# Patient Record
Sex: Female | Born: 1975 | Race: White | Hispanic: No | Marital: Married
Health system: Midwestern US, Community
[De-identification: ages and names within clinical notes are randomized; demographics above are authoritative.]

## PROBLEM LIST (undated history)

## (undated) DIAGNOSIS — Z8742 Personal history of other diseases of the female genital tract: Secondary | ICD-10-CM

## (undated) DIAGNOSIS — J302 Other seasonal allergic rhinitis: Secondary | ICD-10-CM

## (undated) DIAGNOSIS — R112 Nausea with vomiting, unspecified: Secondary | ICD-10-CM

## (undated) DIAGNOSIS — I10 Essential (primary) hypertension: Secondary | ICD-10-CM

## (undated) DIAGNOSIS — Z9889 Other specified postprocedural states: Secondary | ICD-10-CM

## (undated) HISTORY — PX: APPENDECTOMY: SHX54

## (undated) HISTORY — PX: BREAST SURGERY: SHX581

## (undated) HISTORY — PX: DIAGNOSTIC LAPAROSCOPY: SUR761

## (undated) HISTORY — PX: KNEE SURGERY: SHX244

## (undated) HISTORY — PX: OTHER SURGICAL HISTORY: SHX169

---

## 2012-06-28 ENCOUNTER — Other Ambulatory Visit (HOSPITAL_COMMUNITY): Payer: Self-pay | Admitting: Obstetrics and Gynecology

## 2012-06-28 DIAGNOSIS — N979 Female infertility, unspecified: Secondary | ICD-10-CM

## 2012-07-06 ENCOUNTER — Ambulatory Visit (HOSPITAL_COMMUNITY)
Admission: RE | Admit: 2012-07-06 | Discharge: 2012-07-06 | Disposition: A | Discharge status: Home / Self Care | Payer: BC Managed Care – PPO | Source: Ambulatory Visit | Attending: Obstetrics and Gynecology | Admitting: Obstetrics and Gynecology

## 2012-07-06 DIAGNOSIS — N979 Female infertility, unspecified: Secondary | ICD-10-CM | POA: Insufficient documentation

## 2012-07-06 MED ORDER — IOHEXOL 300 MG/ML  SOLN: 20.0000 mL | Freq: Once | INTRAMUSCULAR | Status: AC | PRN

## 2012-07-07 ENCOUNTER — Other Ambulatory Visit: Payer: Self-pay | Admitting: Obstetrics and Gynecology

## 2012-07-07 DIAGNOSIS — N979 Female infertility, unspecified: Secondary | ICD-10-CM

## 2012-07-09 ENCOUNTER — Ambulatory Visit
Admission: RE | Admit: 2012-07-09 | Discharge: 2012-07-09 | Disposition: A | Discharge status: Home / Self Care | Payer: BC Managed Care – PPO | Source: Ambulatory Visit | Attending: Obstetrics and Gynecology | Admitting: Obstetrics and Gynecology

## 2012-07-09 DIAGNOSIS — N979 Female infertility, unspecified: Secondary | ICD-10-CM

## 2012-10-03 ENCOUNTER — Other Ambulatory Visit: Payer: Self-pay | Admitting: Obstetrics and Gynecology

## 2012-10-05 ENCOUNTER — Encounter (HOSPITAL_COMMUNITY): Payer: Self-pay | Admitting: Pharmacist

## 2012-10-10 ENCOUNTER — Encounter (HOSPITAL_COMMUNITY): Payer: Self-pay

## 2012-10-10 ENCOUNTER — Encounter (HOSPITAL_COMMUNITY)
Admission: RE | Admit: 2012-10-10 | Discharge: 2012-10-10 | Disposition: A | Discharge status: Home / Self Care | Payer: BC Managed Care – PPO | Source: Ambulatory Visit | Attending: Obstetrics and Gynecology | Admitting: Obstetrics and Gynecology

## 2012-10-10 HISTORY — DX: Essential (primary) hypertension: I10

## 2012-10-10 HISTORY — DX: Other seasonal allergic rhinitis: J30.2

## 2012-10-10 HISTORY — DX: Other specified postprocedural states: Z98.890

## 2012-10-10 HISTORY — DX: Other specified postprocedural states: R11.2

## 2012-10-10 HISTORY — DX: Personal history of other diseases of the female genital tract: Z87.42

## 2012-10-10 LAB — CBC
HCT: 37.6 % (ref 36.0–46.0)
Hemoglobin: 13.2 g/dL (ref 12.0–15.0)
MCV: 79.7 fL (ref 78.0–100.0)
RBC: 4.72 MIL/uL (ref 3.87–5.11)
WBC: 6.5 10*3/uL (ref 4.0–10.5)

## 2012-10-10 LAB — BASIC METABOLIC PANEL
BUN: 13 mg/dL (ref 6–23)
CO2: 25 mEq/L (ref 19–32)
Chloride: 98 mEq/L (ref 96–112)
Creatinine, Ser: 0.58 mg/dL (ref 0.50–1.10)
Potassium: 3.8 mEq/L (ref 3.5–5.1)

## 2012-10-10 NOTE — Patient Instructions (Addendum)
   Your procedure is scheduled on: Friday, Jan 17  Enter through the [employer] of [facility] at: 1130 am Pick up the phone at the desk and dial [phone] and inform us of your arrival.  Please call this number if you have any problems the morning of surgery: [phone]  Remember: Do not eat food after midnight: Thursday Do not drink clear liquids after: 9 am Friday Take these medicines the morning of surgery with a SIP OF WATER: labetalol  Do not wear jewelry, make-up, or FINGER nail polish No metal in your hair or on your body. Do not wear lotions, powders, perfumes. You may wear deodorant.  Please use your CHG wash as directed prior to surgery.  Do not shave anywhere for at least 12 hours prior to first CHG shower.  Do not bring valuables to the hospital. Contacts, dentures or bridgework may not be worn into surgery.  Patients discharged on the day of surgery will not be allowed to drive home.  Home with husband Francee Piccolo.

## 2012-10-12 NOTE — H&P (Signed)
Candace Hudson, Candace Hudson                 ACCOUNT NO.:  [id number]  MEDICAL RECORD NO.:  [id number]  LOCATION:  [facility]                         FACILITY:  [facility]  PHYSICIAN:  Lenoard Aden, M.D.DATE OF BIRTH:  1975-12-23  DATE OF ADMISSION:  10/02/2012 DATE OF DISCHARGE:  10/10/2012                             HISTORY & PHYSICAL   CHIEF COMPLAINT:  Pelvic pain, dysmenorrhea, and fallopian tube obstruction.  HISTORY OF PRESENT ILLNESS:  She is a 37 year old white female, G0, P0 with history of ovarian cyst, pelvic pain, dysmenorrhea, and left tubal obstruction by HSG, who presents for surgical intervention.  ALLERGIES:  She has allergies to codeine derivatives and hydrocodone.  SOCIAL HISTORY:  She is a nonsmoker, nondrinker.  She denies domestic or physical violence.  FAMILY HISTORY:  She has a family history of liver cancer, heart disease, and hypertension.  Noncontributory pregnancy history.  PAST SURGICAL HISTORY:  Breast reduction, appendectomy, and torn ACL with repair.  MEDICATIONS:  Glucophage, labetalol, fish oil, HCTZ, calcium, vitamins, and Zyrtec as needed.  PHYSICAL EXAMINATION:  GENERAL:  She is a well-developed, well- nourished, white female, in no acute distress. VITAL SIGNS:  Height of 60 inches and weight of 175 pounds. HEENT:  Normal. NECK:  Supple.  Full range of motion. LUNGS:  Clear. HEART:  Regular rhythm. ABDOMEN:  Soft, nontender. PELVIC:  Reveals the uterus to be anteverted, mobile with no adnexal masses appreciated. EXTREMITIES:  There are no cords. NEUROLOGIC:  Nonfocal. SKIN:  intact.  IMPRESSION: 1. Pelvic pain. 2. History of left tubal obstruction. 3. History of ovarian cyst suspicious for endometriosis.  PLAN:  Proceed with diagnostic laparoscopy, possible with a da Vinci assisted excision and/or removal of endometriosis, palpable chromopertubation, possible tuboplasty risks of anesthesia, infection, bleeding, injury to abdominal organs,  need for repair is discussed.  Delayed versus immediate complications to include bowel and bladder injury noted. Inability to cure pain and/or infertility have been noted.  The patient acknowledges and wishes to proceed.     Lenoard Aden, M.D.     RJT/MEDQ  D:  10/12/2012  T:  10/12/2012  Job:  [id number]

## 2012-10-13 ENCOUNTER — Encounter (HOSPITAL_COMMUNITY): Payer: Self-pay

## 2012-10-13 ENCOUNTER — Ambulatory Visit (HOSPITAL_COMMUNITY)
Admission: RE | Admit: 2012-10-13 | Discharge: 2012-10-13 | Disposition: A | Discharge status: Home / Self Care | Payer: BC Managed Care – PPO | Source: Ambulatory Visit | Attending: Obstetrics and Gynecology | Admitting: Obstetrics and Gynecology

## 2012-10-13 ENCOUNTER — Encounter (HOSPITAL_COMMUNITY)
Admission: RE | Disposition: A | Discharge status: Home / Self Care | Payer: Self-pay | Source: Ambulatory Visit | Attending: Obstetrics and Gynecology

## 2012-10-13 ENCOUNTER — Ambulatory Visit (HOSPITAL_COMMUNITY): Payer: BC Managed Care – PPO

## 2012-10-13 DIAGNOSIS — N801 Endometriosis of ovary: Secondary | ICD-10-CM | POA: Insufficient documentation

## 2012-10-13 DIAGNOSIS — N7013 Chronic salpingitis and oophoritis: Secondary | ICD-10-CM | POA: Insufficient documentation

## 2012-10-13 DIAGNOSIS — N946 Dysmenorrhea, unspecified: Secondary | ICD-10-CM | POA: Insufficient documentation

## 2012-10-13 DIAGNOSIS — Z01818 Encounter for other preprocedural examination: Secondary | ICD-10-CM | POA: Insufficient documentation

## 2012-10-13 DIAGNOSIS — N809 Endometriosis, unspecified: Secondary | ICD-10-CM

## 2012-10-13 DIAGNOSIS — N803 Endometriosis of pelvic peritoneum, unspecified: Secondary | ICD-10-CM | POA: Insufficient documentation

## 2012-10-13 DIAGNOSIS — N80109 Endometriosis of ovary, unspecified side, unspecified depth: Secondary | ICD-10-CM | POA: Insufficient documentation

## 2012-10-13 DIAGNOSIS — Z01812 Encounter for preprocedural laboratory examination: Secondary | ICD-10-CM | POA: Insufficient documentation

## 2012-10-13 DIAGNOSIS — N949 Unspecified condition associated with female genital organs and menstrual cycle: Secondary | ICD-10-CM | POA: Insufficient documentation

## 2012-10-13 DIAGNOSIS — N83209 Unspecified ovarian cyst, unspecified side: Secondary | ICD-10-CM | POA: Insufficient documentation

## 2012-10-13 DIAGNOSIS — N971 Female infertility of tubal origin: Secondary | ICD-10-CM | POA: Insufficient documentation

## 2012-10-13 HISTORY — PX: ABLATION ON ENDOMETRIOSIS: SHX5787

## 2012-10-13 HISTORY — PX: LAPAROSCOPY: SHX197

## 2012-10-13 HISTORY — PX: ROBOTIC ASSISTED LAPAROSCOPIC OVARIAN CYSTECTOMY: SHX6081

## 2012-10-13 HISTORY — PX: ROBOTIC ASSISTED LAPAROSCOPIC LYSIS OF ADHESION: SHX6080

## 2012-10-13 SURGERY — LAPAROSCOPY OPERATIVE
Anesthesia: General | Site: Abdomen | Wound class: Clean Contaminated

## 2012-10-13 MED ORDER — TRAMADOL HCL 50 MG PO TABS: 50.0000 mg | ORAL_TABLET | Freq: Four times a day (QID) | ORAL | Status: DC | PRN

## 2012-10-13 MED ORDER — LACTATED RINGERS IV SOLN
INTRAVENOUS | Status: DC
  Administered 2012-10-13: 12:00:00 via INTRAVENOUS
  Administered 2012-10-13: 50 mL/h via INTRAVENOUS
  Administered 2012-10-13: 15:00:00 via INTRAVENOUS

## 2012-10-13 MED ORDER — CEFAZOLIN SODIUM-DEXTROSE 2-3 GM-% IV SOLR
INTRAVENOUS | Status: AC
  Filled 2012-10-13: qty 50

## 2012-10-13 MED ORDER — FENTANYL CITRATE 0.05 MG/ML IJ SOLN
INTRAMUSCULAR | Status: AC
  Administered 2012-10-13: 50 ug via INTRAVENOUS
  Filled 2012-10-13: qty 2

## 2012-10-13 MED ORDER — METHYLENE BLUE 1 % INJ SOLN
INTRAMUSCULAR | Status: DC | PRN
  Administered 2012-10-13: 1 mL

## 2012-10-13 MED ORDER — KETOROLAC TROMETHAMINE 30 MG/ML IJ SOLN
INTRAMUSCULAR | Status: AC
  Filled 2012-10-13: qty 1

## 2012-10-13 MED ORDER — PROPOFOL 10 MG/ML IV EMUL
INTRAVENOUS | Status: AC
  Filled 2012-10-13: qty 20

## 2012-10-13 MED ORDER — SCOPOLAMINE 1 MG/3DAYS TD PT72
1.0000 | MEDICATED_PATCH | TRANSDERMAL | Status: DC
  Administered 2012-10-13: 1.5 mg via TRANSDERMAL

## 2012-10-13 MED ORDER — DEXAMETHASONE SODIUM PHOSPHATE 4 MG/ML IJ SOLN
INTRAMUSCULAR | Status: DC | PRN
  Administered 2012-10-13: 10 mg via INTRAVENOUS

## 2012-10-13 MED ORDER — ROCURONIUM BROMIDE 50 MG/5ML IV SOLN
INTRAVENOUS | Status: AC
  Filled 2012-10-13: qty 1

## 2012-10-13 MED ORDER — LIDOCAINE HCL (CARDIAC) 20 MG/ML IV SOLN
INTRAVENOUS | Status: AC
  Filled 2012-10-13: qty 5

## 2012-10-13 MED ORDER — ONDANSETRON HCL 4 MG/2ML IJ SOLN
INTRAMUSCULAR | Status: DC | PRN
  Administered 2012-10-13: 4 mg via INTRAVENOUS

## 2012-10-13 MED ORDER — FENTANYL CITRATE 0.05 MG/ML IJ SOLN
INTRAMUSCULAR | Status: AC
  Filled 2012-10-13: qty 5

## 2012-10-13 MED ORDER — BUPIVACAINE HCL (PF) 0.25 % IJ SOLN
INTRAMUSCULAR | Status: DC | PRN
  Administered 2012-10-13: 10 mL

## 2012-10-13 MED ORDER — NEOSTIGMINE METHYLSULFATE 1 MG/ML IJ SOLN
INTRAMUSCULAR | Status: DC | PRN
  Administered 2012-10-13: 3 mg via INTRAVENOUS

## 2012-10-13 MED ORDER — KETOROLAC TROMETHAMINE 30 MG/ML IJ SOLN
INTRAMUSCULAR | Status: DC | PRN
  Administered 2012-10-13: 30 mg via INTRAVENOUS

## 2012-10-13 MED ORDER — KETOROLAC TROMETHAMINE 30 MG/ML IJ SOLN: 15.0000 mg | Freq: Once | INTRAMUSCULAR | Status: DC | PRN

## 2012-10-13 MED ORDER — ROCURONIUM BROMIDE 100 MG/10ML IV SOLN
INTRAVENOUS | Status: DC | PRN
  Administered 2012-10-13: 5 mg via INTRAVENOUS
  Administered 2012-10-13 (×2): 10 mg via INTRAVENOUS
  Administered 2012-10-13: 40 mg via INTRAVENOUS

## 2012-10-13 MED ORDER — LACTATED RINGERS IR SOLN
Status: DC | PRN
  Administered 2012-10-13: 3000 mL

## 2012-10-13 MED ORDER — FENTANYL CITRATE 0.05 MG/ML IJ SOLN
25.0000 ug | INTRAMUSCULAR | Status: DC | PRN
  Administered 2012-10-13: 50 ug via INTRAVENOUS

## 2012-10-13 MED ORDER — LIDOCAINE HCL (CARDIAC) 20 MG/ML IV SOLN
INTRAVENOUS | Status: DC | PRN
  Administered 2012-10-13: 50 mg via INTRAVENOUS

## 2012-10-13 MED ORDER — GLYCOPYRROLATE 0.2 MG/ML IJ SOLN
INTRAMUSCULAR | Status: DC | PRN
  Administered 2012-10-13: 0.4 mg via INTRAVENOUS

## 2012-10-13 MED ORDER — MIDAZOLAM HCL 5 MG/5ML IJ SOLN
INTRAMUSCULAR | Status: DC | PRN
  Administered 2012-10-13: 2 mg via INTRAVENOUS

## 2012-10-13 MED ORDER — SCOPOLAMINE 1 MG/3DAYS TD PT72
MEDICATED_PATCH | TRANSDERMAL | Status: AC
  Administered 2012-10-13: 1.5 mg via TRANSDERMAL
  Filled 2012-10-13: qty 1

## 2012-10-13 MED ORDER — METHYLENE BLUE 1 % INJ SOLN
INTRAMUSCULAR | Status: AC
  Filled 2012-10-13: qty 1

## 2012-10-13 MED ORDER — FENTANYL CITRATE 0.05 MG/ML IJ SOLN
INTRAMUSCULAR | Status: DC | PRN
  Administered 2012-10-13: 50 ug via INTRAVENOUS
  Administered 2012-10-13 (×2): 100 ug via INTRAVENOUS

## 2012-10-13 MED ORDER — SODIUM CHLORIDE 0.9 % IJ SOLN
INTRAMUSCULAR | Status: DC | PRN
  Administered 2012-10-13: 10 mL

## 2012-10-13 MED ORDER — CEFAZOLIN SODIUM-DEXTROSE 2-3 GM-% IV SOLR
2.0000 g | INTRAVENOUS | Status: AC
  Administered 2012-10-13: 2 g via INTRAVENOUS

## 2012-10-13 MED ORDER — ACETAMINOPHEN 10 MG/ML IV SOLN
INTRAVENOUS | Status: AC
  Administered 2012-10-13: 1000 mg via INTRAVENOUS
  Filled 2012-10-13: qty 100

## 2012-10-13 MED ORDER — OXYCODONE-ACETAMINOPHEN 5-325 MG PO TABS: 1.0000 | ORAL_TABLET | ORAL | Status: DC | PRN

## 2012-10-13 MED ORDER — ACETAMINOPHEN 10 MG/ML IV SOLN
1000.0000 mg | Freq: Once | INTRAVENOUS | Status: AC
  Administered 2012-10-13: 1000 mg via INTRAVENOUS

## 2012-10-13 MED ORDER — LACTATED RINGERS IR SOLN
Status: DC | PRN
  Administered 2012-10-13: 1000 mL

## 2012-10-13 MED ORDER — MIDAZOLAM HCL 2 MG/2ML IJ SOLN
INTRAMUSCULAR | Status: AC
  Filled 2012-10-13: qty 2

## 2012-10-13 MED ORDER — BUPIVACAINE HCL (PF) 0.25 % IJ SOLN
INTRAMUSCULAR | Status: AC
  Filled 2012-10-13: qty 30

## 2012-10-13 MED ORDER — NEOSTIGMINE METHYLSULFATE 1 MG/ML IJ SOLN
INTRAMUSCULAR | Status: AC
  Filled 2012-10-13: qty 1

## 2012-10-13 MED ORDER — GLYCOPYRROLATE 0.2 MG/ML IJ SOLN
INTRAMUSCULAR | Status: AC
  Filled 2012-10-13: qty 2

## 2012-10-13 MED ORDER — PROPOFOL 10 MG/ML IV EMUL
INTRAVENOUS | Status: DC | PRN
  Administered 2012-10-13: 80 mg via INTRAVENOUS
  Administered 2012-10-13: 200 mg via INTRAVENOUS

## 2012-10-13 SURGICAL SUPPLY — 82 items
ADH SKN CLS APL DERMABOND .7 (GAUZE/BANDAGES/DRESSINGS) ×1
BAG SPEC RTRVL LRG 6X4 10 (ENDOMECHANICALS)
BARRIER ADHS 3X4 INTERCEED (GAUZE/BANDAGES/DRESSINGS) ×2 IMPLANT
BRR ADH 4X3 ABS CNTRL BYND (GAUZE/BANDAGES/DRESSINGS) ×1
CABLE HIGH FREQUENCY MONO STRZ (ELECTRODE) IMPLANT
CATH ROBINSON RED A/P 16FR (CATHETERS) IMPLANT
CLOTH BEACON ORANGE TIMEOUT ST (SAFETY) ×2 IMPLANT
CONT PATH 16OZ SNAP LID 3702 (MISCELLANEOUS) ×2 IMPLANT
COVER MAYO STAND STRL (DRAPES) ×2 IMPLANT
COVER TABLE BACK 60X90 (DRAPES) ×4 IMPLANT
COVER TIP SHEARS 8 DVNC (MISCELLANEOUS) ×1 IMPLANT
COVER TIP SHEARS 8MM DA VINCI (MISCELLANEOUS) ×1
DECANTER SPIKE VIAL GLASS SM (MISCELLANEOUS) ×2 IMPLANT
DERMABOND ADVANCED (GAUZE/BANDAGES/DRESSINGS) ×1
DERMABOND ADVANCED .7 DNX12 (GAUZE/BANDAGES/DRESSINGS) ×1 IMPLANT
DRAPE HUG U DISPOSABLE (DRAPE) ×2 IMPLANT
DRAPE LG THREE QUARTER DISP (DRAPES) ×4 IMPLANT
DRAPE WARM FLUID 44X44 (DRAPE) ×2 IMPLANT
ELECT REM PT RETURN 9FT ADLT (ELECTROSURGICAL) ×2
ELECTRODE REM PT RTRN 9FT ADLT (ELECTROSURGICAL) ×1 IMPLANT
EVACUATOR SMOKE 8.L (FILTER) ×2 IMPLANT
FORCEPS CUTTING 33CM 5MM (CUTTING FORCEPS) IMPLANT
FORCEPS CUTTING 45CM 5MM (CUTTING FORCEPS) IMPLANT
GAUZE VASELINE 3X9 (GAUZE/BANDAGES/DRESSINGS) IMPLANT
GLOVE BIO SURGEON STRL SZ7.5 (GLOVE) ×4 IMPLANT
GLOVE ECLIPSE 6.0 STRL STRAW (GLOVE) ×1 IMPLANT
GOWN PREVENTION PLUS LG XLONG (DISPOSABLE) ×4 IMPLANT
GOWN PREVENTION PLUS XLARGE (GOWN DISPOSABLE) ×2 IMPLANT
GOWN STRL REIN XL XLG (GOWN DISPOSABLE) ×12 IMPLANT
GYRUS RUMI II 2.5CM BLUE (DISPOSABLE)
GYRUS RUMI II 3.5CM BLUE (DISPOSABLE)
GYRUS RUMI II 4.0CM BLUE (DISPOSABLE)
KIT ACCESSORY DA VINCI DISP (KITS) ×1
KIT ACCESSORY DVNC DISP (KITS) ×1 IMPLANT
NDL HYPO 18GX1.5 BLUNT FILL (NEEDLE) IMPLANT
NDL INSUFFLATION 14GA 120MM (NEEDLE) ×1 IMPLANT
NEEDLE HYPO 18GX1.5 BLUNT FILL (NEEDLE) ×2 IMPLANT
NEEDLE INSUFFLATION 14GA 120MM (NEEDLE) ×2 IMPLANT
NS IRRIG 1000ML POUR BTL (IV SOLUTION) ×6 IMPLANT
OCCLUDER COLPOPNEUMO (BALLOONS) IMPLANT
PACK LAPAROSCOPY BASIN (CUSTOM PROCEDURE TRAY) ×2 IMPLANT
PACK LAVH (CUSTOM PROCEDURE TRAY) ×2 IMPLANT
PAD PREP 24X48 CUFFED NSTRL (MISCELLANEOUS) ×4 IMPLANT
POUCH SPECIMEN RETRIEVAL 10MM (ENDOMECHANICALS) IMPLANT
PROTECTOR NERVE ULNAR (MISCELLANEOUS) ×4 IMPLANT
RUMI II 3.0CM BLUE KOH-EFFICIE (DISPOSABLE) IMPLANT
RUMI II GYRUS 2.5CM BLUE (DISPOSABLE) IMPLANT
RUMI II GYRUS 3.5CM BLUE (DISPOSABLE) IMPLANT
RUMI II GYRUS 4.0CM BLUE (DISPOSABLE) IMPLANT
SET CYSTO W/LG BORE CLAMP LF (SET/KITS/TRAYS/PACK) IMPLANT
SET IRRIG TUBING LAPAROSCOPIC (IRRIGATION / IRRIGATOR) ×2 IMPLANT
SLEEVE Z-THREAD 5X100MM (TROCAR) IMPLANT
SOLUTION ELECTROLUBE (MISCELLANEOUS) ×2 IMPLANT
SUT VIC AB 0 CT1 27 (SUTURE)
SUT VIC AB 0 CT1 27XBRD ANTBC (SUTURE) IMPLANT
SUT VIC AB 3-0 SH 27 (SUTURE) ×2
SUT VIC AB 3-0 SH 27X BRD (SUTURE) ×1 IMPLANT
SUT VICRYL 0 UR6 27IN ABS (SUTURE) ×2 IMPLANT
SUT VICRYL 4-0 PS2 18IN ABS (SUTURE) ×4 IMPLANT
SUT VICRYL RAPIDE 4/0 PS 2 (SUTURE) IMPLANT
SUT VLOC 180 3-0 9IN GS21 (SUTURE) IMPLANT
SYR 50ML LL SCALE MARK (SYRINGE) ×4 IMPLANT
SYR 5ML LL (SYRINGE) ×1 IMPLANT
SYRINGE 10CC LL (SYRINGE) ×2 IMPLANT
SYSTEM CONVERTIBLE TROCAR (TROCAR) IMPLANT
TIP UTERINE 5.1X6CM LAV DISP (MISCELLANEOUS) IMPLANT
TIP UTERINE 6.7X10CM GRN DISP (MISCELLANEOUS) IMPLANT
TIP UTERINE 6.7X6CM WHT DISP (MISCELLANEOUS) IMPLANT
TIP UTERINE 6.7X8CM BLUE DISP (MISCELLANEOUS) IMPLANT
TOWEL OR 17X24 6PK STRL BLUE (TOWEL DISPOSABLE) ×6 IMPLANT
TRAY FOLEY BAG SILVER LF 14FR (CATHETERS) ×2 IMPLANT
TRAY FOLEY CATH 14FR (SET/KITS/TRAYS/PACK) ×2 IMPLANT
TROCAR DISP BLADELESS 8 DVNC (TROCAR) ×1 IMPLANT
TROCAR DISP BLADELESS 8MM (TROCAR) ×1
TROCAR XCEL 12X100 BLDLESS (ENDOMECHANICALS) IMPLANT
TROCAR XCEL NON-BLD 5MMX100MML (ENDOMECHANICALS) ×2 IMPLANT
TROCAR Z-THREAD 12X150 (TROCAR) ×2 IMPLANT
TROCAR Z-THREAD BLADED 11X100M (TROCAR) ×2 IMPLANT
TROCAR Z-THREAD BLADED 5X100MM (TROCAR) IMPLANT
TUBING FILTER THERMOFLATOR (ELECTROSURGICAL) ×2 IMPLANT
WARMER LAPAROSCOPE (MISCELLANEOUS) ×2 IMPLANT
WATER STERILE IRR 1000ML POUR (IV SOLUTION) ×2 IMPLANT

## 2012-10-13 NOTE — Transfer of Care (Signed)
Immediate Anesthesia Transfer of Care Note  Patient: Candace Hudson  Procedure(s) Performed: Procedure(s) (LRB) with comments: LAPAROSCOPY OPERATIVE (N/A) ROBOTIC ASSISTED LAPAROSCOPIC OVARIAN CYSTECTOMY (N/A) ROBOTIC ASSISTED LAPAROSCOPIC LYSIS OF ADHESION (N/A) ABLATION ON ENDOMETRIOSIS (N/A)  Patient Location: PACU  Anesthesia Type:General  Level of Consciousness: awake, alert  and oriented  Airway & Oxygen Therapy: Patient Spontanous Breathing and Patient connected to nasal cannula oxygen  Post-op Assessment: Report given to PACU RN and Post -op Vital signs reviewed and stable  Post vital signs: stable  Complications: No apparent anesthesia complications

## 2012-10-13 NOTE — Anesthesia Postprocedure Evaluation (Signed)
Anesthesia Post Note  Patient: Candace Hudson  Procedure(s) Performed: Procedure(s) (LRB): LAPAROSCOPY OPERATIVE (N/A) ROBOTIC ASSISTED LAPAROSCOPIC OVARIAN CYSTECTOMY (N/A) ROBOTIC ASSISTED LAPAROSCOPIC LYSIS OF ADHESION (N/A) ABLATION ON ENDOMETRIOSIS (N/A)  Anesthesia type: GA  Patient location: PACU  Post pain: Pain level controlled  Post assessment: Post-op Vital signs reviewed  Last Vitals:  Filed Vitals:   10/13/12 1630  BP: 114/57  Pulse: 77  Temp: 37.4 C  Resp: 22    Post vital signs: Reviewed  Level of consciousness: sedated  Complications: No apparent anesthesia complications

## 2012-10-13 NOTE — Anesthesia Preprocedure Evaluation (Addendum)
Anesthesia Evaluation  Patient identified by MRN, date of birth, ID band Patient awake    Reviewed: Allergy & Precautions, H&P , NPO status , Patient's Chart, lab work & pertinent test results, reviewed documented beta blocker date and time   History of Anesthesia Complications (+) PONV  Airway Mallampati: II TM Distance: >3 FB Neck ROM: full    Dental  (+) Teeth Intact   Pulmonary neg pulmonary ROS,  breath sounds clear to auscultation  Pulmonary exam normal       Cardiovascular Exercise Tolerance: Good hypertension, On Home Beta Blockers Rhythm:regular Rate:Normal     Neuro/Psych negative neurological ROS  negative psych ROS   GI/Hepatic negative GI ROS, Neg liver ROS,   Endo/Other  obese  Renal/GU negative Renal ROS  Female GU complaint     Musculoskeletal   Abdominal   Peds  Hematology negative hematology ROS (+)   Anesthesia Other Findings   Reproductive/Obstetrics negative OB ROS                          Anesthesia Physical Anesthesia Plan  ASA: II  Anesthesia Plan: General ETT   Post-op Pain Management:    Induction:   Airway Management Planned:   Additional Equipment:   Intra-op Plan:   Post-operative Plan:   Informed Consent: I have reviewed the patients History and Physical, chart, labs and discussed the procedure including the risks, benefits and alternatives for the proposed anesthesia with the patient or authorized representative who has indicated his/her understanding and acceptance.   Dental Advisory Given  Plan Discussed with: CRNA and Surgeon  Anesthesia Plan Comments:         Anesthesia Quick Evaluation

## 2012-10-13 NOTE — Op Note (Signed)
10/13/2012  3:23 PM  PATIENT:  Candace Hudson  37 y.o. 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  PROCEDURE:  Procedure(s): LAPAROSCOPY OPERATIVE ROBOTIC ASSISTED LAPAROSCOPIC OVARIAN CYSTECTOMY ON RIGHT OVARY AND LEFT OVARY ROBOTIC ASSISTED LAPAROSCOPIC LYSIS OF ADHESION ABLATION ON ENDOMETRIOSIS EXCISION OF ENDOMETRIOSIS  SURGEON:  Surgeon(s): Lenoard Aden, MD Serita Kyle, MD  ASSISTANTS: COUSINS   ANESTHESIA:   local and general  ESTIMATED BLOOD LOSS:50cc  DRAINS: Urinary Catheter (Foley)   LOCAL MEDICATIONS USED:  MARCAINE     SPECIMEN:  Source of Specimen:  OVARIAN CYST WALL  DISPOSITION OF SPECIMEN:  PATHOLOGY  COUNTS:  YES  DICTATION #: [id number]  PLAN OF CARE: DC HOME  PATIENT DISPOSITION:  PACU - hemodynamically stable.

## 2012-10-13 NOTE — Progress Notes (Signed)
Patient ID: Candace Hudson, female   DOB: 02-Jul-1976, 37 y.o.   MRN: [id number] Patient seen and examined. Consent witnessed and signed. No changes noted. Update completed.

## 2012-10-14 NOTE — Op Note (Signed)
Candace Hudson, USERY                 ACCOUNT NO.:  [id number]  MEDICAL RECORD NO.:  [id number]  LOCATION:  [facility]                          FACILITY:  [facility]  PHYSICIAN:  Lenoard Aden, M.D.DATE OF BIRTH:  [birth year]  DATE OF PROCEDURE:  10/13/2012 DATE OF DISCHARGE:  10/13/2012                              OPERATIVE REPORT   DESCRIPTION OF PROCEDURE:  After being apprised of risks of anesthesia, infection, bleeding, injury to abdominal organs, need for repair, delayed versus immediate complications to include bowel and bladder injury, possible need for repair, the patient was brought to the operating room where he was administered a general anesthetic without complications.  Prepped and draped in usual sterile fashion.  Feet were placed in Yellofin stirrups.  Exam under anesthesia reveals an anteflexed uterus and fullness in the cul-de-sac.  Rectal exam was negative at this time.  RUMI retractor placed per vagina without difficulty.  Infraumbilical incision made with a scalpel.  Veress needle placed.  Opening pressure of 11 was noted due to previous history of laparoscopic surgery.  Entry was then made at palmar point where Veress needle was placed, opening pressure of 3 is noted, 4 L of CO2 insufflated without difficulty.  Atraumatic trocar entry of 15 mm trocars placed in the umbilical area.  Visualization reveals atraumatic trocar entry.  Bilateral ports were made 5 mm, 8 mm on the left and two 8 mm on the right and visualization reveals severe pelvic endometriosis with complete obliteration of the cul-de-sac.  The anterior cul-de-sac was clear.  There was a large left hydrosalpinx and a somewhat dilated but otherwise normal-appearing right tube.  At this time, deep Trendelenburg position was established and the robotic ports were established, docked, PK progressed, and Endo Shears were placed.  At this time, the uterus was anteflexed and adhesions of the bowel, left and right tube  to the posterior uterine wall were dissected sharply, gaining entry into the cul-de-sac where the right ovary was completely adherent into the cul-de-sac.  The ovary was sharply dissected off the back of the uterus.  Entry was made revealing a large endometrioma.  The cyst wall was then removed sharply as the ovary was completely dissected out of the cul-de-sac and off the right pelvic sidewall.  Good hemostasis was noted.  Ureters appears normal on the right.  The right tube was previously normal on HSG and appears slightly distorted but otherwise normal fimbria are appreciated.  On the left, the large hydrosalpinx is noted and complete adherence of the left ovary into the ovarian fossa  Retroperitoneal space was entered along the left, dissecting along the medial leaf of the peritoneum.  The ureter was not seen.  Dissection was then started from the cul-de-sac along the lateral side wall and was achieved freeing up and draining in endometrioma, removal of cyst wall and freeing of the left ovary performed without difficulty.  The left hydrosalpinx was noted.  Interceed was placed in the cul-de-sac and around the right ovary.  Good hemostasis was noted. All instruments were removed.  The robot was undocked.  Irrigation was accomplished.  Incisions were closed using 0 Vicryl, 4-0 Vicryl, and Dermabond.  Foley  catheter was removed.  Urine was clear.  RUMI retractors removed.  The patient tolerated the procedure well, was awakened, and transferred to recovery in good condition.     Lenoard Aden, M.D.    RJT/MEDQ  D:  10/13/2012  T:  10/14/2012  Job:  [id number]

## 2012-10-16 ENCOUNTER — Encounter (HOSPITAL_COMMUNITY): Payer: Self-pay | Admitting: Obstetrics and Gynecology

## 2012-12-22 ENCOUNTER — Encounter (HOSPITAL_COMMUNITY): Payer: Self-pay

## 2012-12-27 ENCOUNTER — Encounter (HOSPITAL_COMMUNITY)
Admission: RE | Admit: 2012-12-27 | Discharge: 2012-12-27 | Disposition: A | Discharge status: Home / Self Care | Payer: BC Managed Care – PPO | Source: Ambulatory Visit | Attending: Obstetrics and Gynecology | Admitting: Obstetrics and Gynecology

## 2012-12-27 LAB — SURGICAL PCR SCREEN
MRSA, PCR: NEGATIVE
Staphylococcus aureus: NEGATIVE

## 2012-12-27 LAB — BASIC METABOLIC PANEL
CO2: 25 mEq/L (ref 19–32)
Calcium: 10.4 mg/dL (ref 8.4–10.5)
GFR calc non Af Amer: >90 mL/min (ref >90)
Potassium: 4 mEq/L (ref 3.5–5.1)
Sodium: 136 mEq/L (ref 135–145)

## 2012-12-27 LAB — CBC
MCH: 28.4 pg (ref 26.0–34.0)
Platelets: 293 10*3/uL (ref 150–400)
RBC: 4.89 MIL/uL (ref 3.87–5.11)

## 2012-12-27 NOTE — Patient Instructions (Addendum)
[address] Ursula Alert M Strohmeyer  12/27/2012   Your procedure is scheduled on:  01/05/13  Enter through the Main Entrance of [facility] at 1100 AM.  Pick up the phone at the desk and dial [phone].   Call this number if you have problems the morning of surgery: [phone]   Remember:   Do not eat food:After Midnight.  Do not drink clear liquids: After Midnight.  Take these medicines the morning of surgery with A SIP OF WATER: labetalol   Do not wear jewelry, make-up or nail polish.  Do not wear lotions, powders, or perfumes. You may wear deodorant.  Do not shave 48 hours prior to surgery.  Do not bring valuables to the hospital.  Contacts, dentures or bridgework may not be worn into surgery.  Leave suitcase in the car. After surgery it may be brought to your room.  For patients admitted to the hospital, checkout time is 11:00 AM the day of discharge.   Patients discharged the day of surgery will not be allowed to drive home.  Name and phone number of your driver: husband  Shaleka Brines  Special Instructions: Shower using CHG 2 nights before surgery and the night before surgery.  If you shower the day of surgery use CHG.  Use special wash - you have one bottle of CHG for all showers.  You should use approximately 1/3 of the bottle for each shower.   Please read over the following fact sheets that you were given: Surgical Site Infection Prevention

## 2013-01-05 ENCOUNTER — Ambulatory Visit (HOSPITAL_BASED_OUTPATIENT_CLINIC_OR_DEPARTMENT_OTHER): Admit: 2013-01-05 | Payer: Self-pay | Admitting: Obstetrics and Gynecology

## 2013-01-05 ENCOUNTER — Ambulatory Visit (HOSPITAL_COMMUNITY): Payer: BC Managed Care – PPO | Admitting: Anesthesiology

## 2013-01-05 ENCOUNTER — Encounter (HOSPITAL_COMMUNITY): Payer: Self-pay | Admitting: Registered Nurse

## 2013-01-05 ENCOUNTER — Encounter (HOSPITAL_COMMUNITY)
Admission: RE | Disposition: A | Discharge status: Home / Self Care | Payer: Self-pay | Source: Ambulatory Visit | Attending: Obstetrics and Gynecology

## 2013-01-05 ENCOUNTER — Encounter (HOSPITAL_COMMUNITY): Payer: Self-pay | Admitting: Anesthesiology

## 2013-01-05 ENCOUNTER — Encounter (HOSPITAL_BASED_OUTPATIENT_CLINIC_OR_DEPARTMENT_OTHER): Payer: Self-pay

## 2013-01-05 ENCOUNTER — Ambulatory Visit (HOSPITAL_COMMUNITY)
Admission: RE | Admit: 2013-01-05 | Discharge: 2013-01-05 | Disposition: A | Discharge status: Home / Self Care | Payer: BC Managed Care – PPO | Source: Ambulatory Visit | Attending: Obstetrics and Gynecology | Admitting: Obstetrics and Gynecology

## 2013-01-05 DIAGNOSIS — N7013 Chronic salpingitis and oophoritis: Secondary | ICD-10-CM | POA: Insufficient documentation

## 2013-01-05 DIAGNOSIS — Z885 Allergy status to narcotic agent status: Secondary | ICD-10-CM | POA: Insufficient documentation

## 2013-01-05 DIAGNOSIS — N949 Unspecified condition associated with female genital organs and menstrual cycle: Secondary | ICD-10-CM | POA: Insufficient documentation

## 2013-01-05 DIAGNOSIS — N946 Dysmenorrhea, unspecified: Secondary | ICD-10-CM | POA: Insufficient documentation

## 2013-01-05 DIAGNOSIS — N802 Endometriosis of fallopian tube: Secondary | ICD-10-CM | POA: Insufficient documentation

## 2013-01-05 DIAGNOSIS — Q5128 Other doubling of uterus, other specified: Secondary | ICD-10-CM | POA: Insufficient documentation

## 2013-01-05 DIAGNOSIS — N80209 Endometriosis of unspecified fallopian tube, unspecified depth: Secondary | ICD-10-CM | POA: Insufficient documentation

## 2013-01-05 HISTORY — PX: LAPAROSCOPIC UNILATERAL SALPINGECTOMY: SHX5934

## 2013-01-05 HISTORY — PX: CHROMOPERTUBATION: SHX6288

## 2013-01-05 HISTORY — PX: HYSTEROSCOPY: SHX211

## 2013-01-05 HISTORY — PX: DILATION AND EVACUATION: SHX1459

## 2013-01-05 SURGERY — SALPINGECTOMY, UNILATERAL, LAPAROSCOPIC
Anesthesia: General | Site: Vagina | Laterality: Right | Wound class: Clean Contaminated

## 2013-01-05 SURGERY — LAPAROSCOPY, DIAGNOSTIC
Anesthesia: General

## 2013-01-05 MED ORDER — DEXAMETHASONE SODIUM PHOSPHATE 10 MG/ML IJ SOLN
INTRAMUSCULAR | Status: AC
  Filled 2013-01-05: qty 1

## 2013-01-05 MED ORDER — MIDAZOLAM HCL 5 MG/5ML IJ SOLN
INTRAMUSCULAR | Status: DC | PRN
  Administered 2013-01-05: 2 mg via INTRAVENOUS

## 2013-01-05 MED ORDER — NEOSTIGMINE METHYLSULFATE 1 MG/ML IJ SOLN
INTRAMUSCULAR | Status: DC | PRN
  Administered 2013-01-05: 2 mg via INTRAVENOUS

## 2013-01-05 MED ORDER — MEPERIDINE HCL 25 MG/ML IJ SOLN: 6.2500 mg | INTRAMUSCULAR | Status: DC | PRN

## 2013-01-05 MED ORDER — KETOROLAC TROMETHAMINE 30 MG/ML IJ SOLN
INTRAMUSCULAR | Status: DC | PRN
  Administered 2013-01-05: 30 mg via INTRAVENOUS

## 2013-01-05 MED ORDER — INDIGOTINDISULFONATE SODIUM 8 MG/ML IJ SOLN
INTRAMUSCULAR | Status: DC | PRN
  Administered 2013-01-05: 8 mL

## 2013-01-05 MED ORDER — GLYCOPYRROLATE 0.2 MG/ML IJ SOLN
INTRAMUSCULAR | Status: AC
  Filled 2013-01-05: qty 1

## 2013-01-05 MED ORDER — SCOPOLAMINE 1 MG/3DAYS TD PT72
MEDICATED_PATCH | TRANSDERMAL | Status: AC
  Administered 2013-01-05: 1.5 mg via TRANSDERMAL
  Filled 2013-01-05: qty 1

## 2013-01-05 MED ORDER — GLYCOPYRROLATE 0.2 MG/ML IJ SOLN
INTRAMUSCULAR | Status: DC | PRN
  Administered 2013-01-05: 0.3 mg via INTRAVENOUS

## 2013-01-05 MED ORDER — PROPOFOL 10 MG/ML IV EMUL
INTRAVENOUS | Status: AC
  Filled 2013-01-05: qty 20

## 2013-01-05 MED ORDER — ROCURONIUM BROMIDE 100 MG/10ML IV SOLN
INTRAVENOUS | Status: DC | PRN
  Administered 2013-01-05: 60 mg via INTRAVENOUS

## 2013-01-05 MED ORDER — ONDANSETRON HCL 4 MG/2ML IJ SOLN
INTRAMUSCULAR | Status: DC | PRN
  Administered 2013-01-05: 4 mg via INTRAVENOUS

## 2013-01-05 MED ORDER — LACTATED RINGERS IV SOLN
INTRAVENOUS | Status: DC
  Administered 2013-01-05 (×2): via INTRAVENOUS

## 2013-01-05 MED ORDER — PROPOFOL 10 MG/ML IV BOLUS
INTRAVENOUS | Status: DC | PRN
  Administered 2013-01-05: 200 mg via INTRAVENOUS

## 2013-01-05 MED ORDER — MIDAZOLAM HCL 2 MG/2ML IJ SOLN
INTRAMUSCULAR | Status: AC
  Filled 2013-01-05: qty 2

## 2013-01-05 MED ORDER — FENTANYL CITRATE 0.05 MG/ML IJ SOLN
INTRAMUSCULAR | Status: AC
  Filled 2013-01-05: qty 5

## 2013-01-05 MED ORDER — SCOPOLAMINE 1 MG/3DAYS TD PT72: 1.0000 | MEDICATED_PATCH | TRANSDERMAL | Status: DC

## 2013-01-05 MED ORDER — BUPIVACAINE-EPINEPHRINE 0.25% -1:200000 IJ SOLN
INTRAMUSCULAR | Status: DC | PRN
  Administered 2013-01-05: 7 mL

## 2013-01-05 MED ORDER — KETOROLAC TROMETHAMINE 30 MG/ML IJ SOLN
INTRAMUSCULAR | Status: AC
  Filled 2013-01-05: qty 1

## 2013-01-05 MED ORDER — ACETAMINOPHEN 10 MG/ML IV SOLN
INTRAVENOUS | Status: DC | PRN
  Administered 2013-01-05: 1000 mg via INTRAVENOUS

## 2013-01-05 MED ORDER — FENTANYL CITRATE 0.05 MG/ML IJ SOLN
INTRAMUSCULAR | Status: DC | PRN
  Administered 2013-01-05 (×4): 50 ug via INTRAVENOUS
  Administered 2013-01-05: 150 ug via INTRAVENOUS
  Administered 2013-01-05 (×3): 50 ug via INTRAVENOUS

## 2013-01-05 MED ORDER — BUPIVACAINE-EPINEPHRINE PF 0.25-1:200000 % IJ SOLN
INTRAMUSCULAR | Status: AC
  Filled 2013-01-05: qty 30

## 2013-01-05 MED ORDER — NEOSTIGMINE METHYLSULFATE 1 MG/ML IJ SOLN
INTRAMUSCULAR | Status: AC
  Filled 2013-01-05: qty 1

## 2013-01-05 MED ORDER — LIDOCAINE HCL (CARDIAC) 20 MG/ML IV SOLN
INTRAVENOUS | Status: AC
  Filled 2013-01-05: qty 5

## 2013-01-05 MED ORDER — CEFAZOLIN SODIUM-DEXTROSE 2-3 GM-% IV SOLR
2.0000 g | Freq: Once | INTRAVENOUS | Status: AC
  Administered 2013-01-05: 2 g via INTRAVENOUS

## 2013-01-05 MED ORDER — VASOPRESSIN 20 UNIT/ML IJ SOLN
INTRAVENOUS | Status: DC | PRN
  Administered 2013-01-05: 13:00:00 via INTRAMUSCULAR

## 2013-01-05 MED ORDER — INDIGOTINDISULFONATE SODIUM 8 MG/ML IJ SOLN
INTRAMUSCULAR | Status: AC
  Filled 2013-01-05: qty 5

## 2013-01-05 MED ORDER — ROCURONIUM BROMIDE 50 MG/5ML IV SOLN
INTRAVENOUS | Status: AC
  Filled 2013-01-05: qty 1

## 2013-01-05 MED ORDER — METOCLOPRAMIDE HCL 5 MG/ML IJ SOLN
INTRAMUSCULAR | Status: AC
  Administered 2013-01-05: 10 mg via INTRAVENOUS
  Filled 2013-01-05: qty 2

## 2013-01-05 MED ORDER — FENTANYL CITRATE 0.05 MG/ML IJ SOLN: 25.0000 ug | INTRAMUSCULAR | Status: DC | PRN

## 2013-01-05 MED ORDER — LACTATED RINGERS IR SOLN
Status: DC | PRN
  Administered 2013-01-05 (×2): 3000 mL

## 2013-01-05 MED ORDER — CEFAZOLIN SODIUM-DEXTROSE 2-3 GM-% IV SOLR
INTRAVENOUS | Status: AC
  Filled 2013-01-05: qty 50

## 2013-01-05 MED ORDER — ONDANSETRON HCL 4 MG/2ML IJ SOLN
INTRAMUSCULAR | Status: AC
  Filled 2013-01-05: qty 2

## 2013-01-05 MED ORDER — BUPIVACAINE HCL (PF) 0.25 % IJ SOLN
INTRAMUSCULAR | Status: AC
  Filled 2013-01-05: qty 30

## 2013-01-05 MED ORDER — LIDOCAINE HCL (CARDIAC) 20 MG/ML IV SOLN
INTRAVENOUS | Status: DC | PRN
  Administered 2013-01-05: 100 mg via INTRAVENOUS

## 2013-01-05 MED ORDER — HYDROMORPHONE HCL 2 MG PO TABS: 2.0000 mg | ORAL_TABLET | ORAL | Status: AC | PRN

## 2013-01-05 MED ORDER — METOCLOPRAMIDE HCL 5 MG/ML IJ SOLN: 10.0000 mg | Freq: Once | INTRAMUSCULAR | Status: AC | PRN

## 2013-01-05 MED ORDER — GLYCINE 1.5 % IR SOLN
Status: DC | PRN
  Administered 2013-01-05: 3000 mL

## 2013-01-05 MED ORDER — VASOPRESSIN 20 UNIT/ML IJ SOLN
INTRAMUSCULAR | Status: AC
  Filled 2013-01-05: qty 1

## 2013-01-05 MED ORDER — DEXAMETHASONE SODIUM PHOSPHATE 10 MG/ML IJ SOLN
INTRAMUSCULAR | Status: DC | PRN
  Administered 2013-01-05: 10 mg via INTRAVENOUS

## 2013-01-05 SURGICAL SUPPLY — 40 items
ADH SKN CLS APL DERMABOND .7 (GAUZE/BANDAGES/DRESSINGS) ×4
BAG SPEC RTRVL LRG 6X4 10 (ENDOMECHANICALS) ×4
BRR ADH 6X5 SEPRAFILM 1 SHT (MISCELLANEOUS) ×4
CATH ROBINSON RED A/P 16FR (CATHETERS) ×5 IMPLANT
CLOTH BEACON ORANGE TIMEOUT ST (SAFETY) ×5 IMPLANT
CONTAINER PREFILL 10% NBF 60ML (FORM) IMPLANT
CORD ACTIVE DISPOSABLE (ELECTRODE) ×1
CORD ELECTRO ACTIVE DISP (ELECTRODE) ×1 IMPLANT
DERMABOND ADVANCED (GAUZE/BANDAGES/DRESSINGS) ×1
DERMABOND ADVANCED .7 DNX12 (GAUZE/BANDAGES/DRESSINGS) ×1 IMPLANT
ELECT NDL TIP 2.8 STRL (NEEDLE) IMPLANT
ELECT NEEDLE TIP 2.8 STRL (NEEDLE) IMPLANT
FORCEPS CUTTING 33CM 5MM (CUTTING FORCEPS) IMPLANT
GLOVE BIO SURGEON STRL SZ8 (GLOVE) ×5 IMPLANT
GLOVE BIOGEL PI IND STRL 8.5 (GLOVE) ×5 IMPLANT
GLOVE BIOGEL PI INDICATOR 8.5 (GLOVE) ×2
GOWN STRL REIN XL XLG (GOWN DISPOSABLE) ×14 IMPLANT
KIT ESSURE FALLOPIAN CLOSURE (Ring) ×5 IMPLANT
MANIPULATOR UTERINE 4.5 ZUMI (MISCELLANEOUS) ×2 IMPLANT
NDL HYPO 18GX1.5 BLUNT FILL (NEEDLE) IMPLANT
NEEDLE HYPO 18GX1.5 BLUNT FILL (NEEDLE) ×5 IMPLANT
NEEDLE INSUFFLATION 120MM (ENDOMECHANICALS) ×2 IMPLANT
PACK HYSTEROSCOPY LF (CUSTOM PROCEDURE TRAY) ×5 IMPLANT
PACK LAPAROSCOPY I 1258 (SET/KITS/TRAYS/PACK) ×2 IMPLANT
PENCIL BUTTON HOLSTER BLD 10FT (ELECTRODE) IMPLANT
POUCH SPECIMEN RETRIEVAL 10MM (ENDOMECHANICALS) ×2 IMPLANT
PROTECTOR NERVE ULNAR (MISCELLANEOUS) ×4 IMPLANT
SCALPEL HARMONIC ACE (MISCELLANEOUS) ×4 IMPLANT
SEPRAFILM MEMBRANE 5X6 (MISCELLANEOUS) ×2 IMPLANT
SET BERKELEY SUCTION TUBING (SUCTIONS) ×2 IMPLANT
SET IRRIG TUBING LAPAROSCOPIC (IRRIGATION / IRRIGATOR) ×2 IMPLANT
SUT VIC AB 2-0 CT1 27 (SUTURE) ×5
SUT VIC AB 2-0 CT1 TAPERPNT 27 (SUTURE) ×1 IMPLANT
SUT VIC AB 2-0 UR6 27 (SUTURE) IMPLANT
SYR TOOMEY 50ML (SYRINGE) ×2 IMPLANT
TOWEL OR 17X24 6PK STRL BLUE (TOWEL DISPOSABLE) ×10 IMPLANT
TRAY FOLEY CATH 14FR (SET/KITS/TRAYS/PACK) ×2 IMPLANT
TROCAR OPTI TIP 5M 100M (ENDOMECHANICALS) ×2 IMPLANT
VACURETTE 7MM CVD STRL WRAP (CANNULA) ×2 IMPLANT
WATER STERILE IRR 1000ML POUR (IV SOLUTION) ×5 IMPLANT

## 2013-01-05 NOTE — Transfer of Care (Signed)
Immediate Anesthesia Transfer of Care Note  Patient: Candace Hudson  Procedure(s) Performed: Procedure(s) with comments: LAPAROSCOPIC UNILATERAL SALPINGECTOMY (Left) HYSTEROSCOPY (N/A) - with Resection of Uterine Septum CHROMOPERTUBATION (Right) DILATATION AND EVACUATION (N/A)  Patient Location: PACU  Anesthesia Type:General  Level of Consciousness: awake, alert  and oriented  Airway & Oxygen Therapy: Patient Spontanous Breathing and Patient connected to nasal cannula oxygen  Post-op Assessment: Report given to PACU RN  Post vital signs: Reviewed  Complications: No apparent anesthesia complications

## 2013-01-05 NOTE — Preoperative (Signed)
Beta Blockers   Reason not to administer Beta Blockers:Not Applicable 

## 2013-01-05 NOTE — Op Note (Signed)
Operative Note  Preoperative diagnosis: Severe endometriosis, left hydrosalpinx, right distal tubal compromise, partial uterine septum Postoperative diagnosis: Severe endometriosis, left hydrosalpinx, right distal tubal compromise, partial uterine septum  Procedure: Laparoscopy, lysis of adhesions, left salpingectomy, right fimbrioplasty, hysteroscopy, incision of uterine septum, suction D&C.  Anesthesia: Gen. endotracheal  Complications: None  Estimated blood loss: 100 cc  Specimens: left tube and [facility] pathology   Findings: On laparoscopy, upper abdomen, liver surface and diaphragm surfaces as well as gallbladder were normal. The appendix was within normal limits. There were diffuse hemosiderin stains on the peritoneal surfaces. There were brown lesions at the insertion of broad ligament into the inguinal canal. Uterine fundus appeared normal. The posterior uterine surface could not be evaluated since the posterior cul-de-sac was obliterated with endometriosis related adhesions. The right fallopian tube had turned around itself and distal end was stuck to the posterior uterine serosa. It appeared edematous and dilated. Chromotubation showed patency of this tube. There was a white lesion of endometriosis about 2 cm from the uterotubal junction on the right fallopian tube did appear to be fibrotic and causing some of the kinking in the tube. This was later ablated with needle electrode. The left tube showed hydrosalpinx with attachment to posterior uterine serosa as well as to the surrounding sigmoid serosa and then the ovary. The left ovary could not be identified well because of adhesions. The right ovary was two thirds covered with dense adhesions. Hysteroscopy showed bilateral tubal ostia, menstrual endometrium and a partial uterine septum extending about 1-1.5 cm.  Description of the procedure: The patient was placed in dorsal supine position and general endotracheal anesthesia was given. 2 g  of cefazolin were given intravenously for prophylaxis. Patient was placed in lithotomy position. She was prepped and draped inside manner. A Foley catheter was inserted into the bladder. A ZUMI catheter was placed into the uterine cavity. The surgeon was regloved and a surgical field was created on the abdomen.  After preemptive anesthesia of all surgical sites with 0.25% bupivacaine with 1 200,000 epinephrine, a 5 mm intraumbilical skin incision was made and a Verress needle was inserted. Its correct location was confirmed. A pneumoperitoneum was created with carbon dioxide. A left lower quadrant 5 mm and a right lower quadrant 10 mm incisions were made and ancillary trochars were placed under direct visualization. Above findings were noted. Using Harmonic Ace grasper, the left tube was first separated carefully from the surrounding attachments to the adherent bowel and uterus and then was severed at the uterotubal junction and the mesosalpinx was successively clamped coagulated and cut. Attention was given to stay very close to the tubal wall and not to coagulate anastomosing blood vessels between the branches of the descending uterine artery and the ovarian artery. The tube was removed through an extension of the 5 mm incision and submitted to pathology. Some lysis of adhesions was performed around the right tube although I was inclined to remove the tube that was already showing some hydrosalpinx, as the phone conversation intraoperatively with the husband of the patient indicated that her preference not to render her sterile. A fimbrioplasty was done On the right tube as phimosis was developing around the fimbria. Bruhat maneuver was performed using the side of the active blade of the harmonic ACE in slow, desiccating mode. Thus eversion of the fimbriae was accomplished  The pelvis was vigorously irrigated and aspirated and hemostasis was checked. A slurry of Seprafilm in 40 mL of lactated Ringer was  instilled into  the pelvis in an attempt to decrease adhesion formation. The gas was allowed to escape. Instruments were removed. The instrument and lap count were correct.  The incisions were approximated with Dermabond.  Next, the surgeon created a field for hysteroscopy. Video hysteroscopy was started with Slimline hysteroscope using initially 1.5% glycine and then lactated Ringer solution. Suction D&C to be performed because the patient was on menstrual day 1 and the endometrium was fluffy. The uterus sounded to 8 cm. Using hysteroscopic scissors partial septum was incised by about 1 cm. Both tubal ostia were seen. The procedure was then terminated  The patient tolerated the procedure well and was transferred to recovery room in satisfactory condition.

## 2013-01-05 NOTE — Anesthesia Postprocedure Evaluation (Signed)
  Anesthesia Post-op Note  Patient: Candace Hudson  Procedure(s) Performed: Procedure(s) with comments: LAPAROSCOPIC UNILATERAL SALPINGECTOMY (Left) HYSTEROSCOPY (N/A) - with Resection of Uterine Septum CHROMOPERTUBATION (Right) DILATATION AND EVACUATION (N/A)  Patient Location: PACU  Anesthesia Type:General  Level of Consciousness: awake, alert  and oriented  Airway and Oxygen Therapy: Patient Spontanous Breathing  Post-op Pain: none  Post-op Assessment: Post-op Vital signs reviewed, Patient's Cardiovascular Status Stable, Respiratory Function Stable, Patent Airway, No signs of Nausea or vomiting and Pain level controlled  Post-op Vital Signs: Reviewed and stable  Complications: No apparent anesthesia complications

## 2013-01-05 NOTE — H&P (Signed)
CC: Severe endometriosis, left hydrosalpinx and partial uterine septum HISTORY OF PRESENT ILLNESS: She is a 37 year old white female, G0, P0  with history of ovarian cyst, pelvic pain, dysmenorrhea, and left tubal  obstruction and hydrosalpinx by previous L/S, who presents for surgical intervention.  ALLERGIES: She has allergies to codeine derivatives and hydrocodone.  SOCIAL HISTORY: She is a nonsmoker, nondrinker. She denies domestic or  physical violence.  FAMILY HISTORY: She has a family history of liver cancer, heart  disease, and hypertension. Noncontributory pregnancy history.  PAST SURGICAL HISTORY: Breast reduction, appendectomy, and torn ACL  with repair.  MEDICATIONS:  labetalol, fish oil, HCTZ, calcium, vitamins,  and Zyrtec as needed.  PHYSICAL EXAMINATION: GENERAL: She is a well-developed, well-  nourished, white female, in no acute distress.  VITAL SIGNS: Height of 60 inches and weight of 175 pounds.  HEENT: Normal.  NECK: Supple. Full range of motion.  LUNGS: Clear.  HEART: Regular rhythm.  ABDOMEN: Soft, nontender.  PELVIC: Reveals the uterus to be anteverted, mobile with no adnexal  masses appreciated.  EXTREMITIES: There are no cords.  NEUROLOGIC: Nonfocal.  SKIN: intact.  IMPRESSION:  1. Pelvic pain.  2. History of left tubal obstruction.  3. History of ovarian cyst suspicious for endometriosis.  PLAN: Proceed with laparoscopy, lt salpingectomy, H/S and incision of septum, risks of anesthesia, infection,  bleeding, injury to  abdominal organs, need for repair is discussed. Delayed versus  immediate complications to include bowel and bladder injury noted.  Inability to cure pain and/or infertility have been noted. The patient  acknowledges and wishes to proceed.

## 2013-01-05 NOTE — Anesthesia Preprocedure Evaluation (Signed)
Anesthesia Evaluation    History of Anesthesia Complications (+) PONV  Airway Mallampati: III TM Distance: >3 FB Neck ROM: Full    Dental no notable dental hx. (+) Teeth Intact   Pulmonary neg pulmonary ROS, Residual Cough,  breath sounds clear to auscultation  Pulmonary exam normal       Cardiovascular hypertension, negative cardio ROS  Rhythm:Regular Rate:Normal     Neuro/Psych negative neurological ROS  negative psych ROS   GI/Hepatic negative GI ROS, Neg liver ROS,   Endo/Other  negative endocrine ROS  Renal/GU negative Renal ROS  negative genitourinary   Musculoskeletal negative musculoskeletal ROS (+)   Abdominal (+) + obese,   Peds  Hematology negative hematology ROS (+)   Anesthesia Other Findings   Reproductive/Obstetrics Right Hydrosalphinx                           Anesthesia Physical Anesthesia Plan  ASA: II  Anesthesia Plan: General   Post-op Pain Management:    Induction: Intravenous  Airway Management Planned: Oral ETT  Additional Equipment:   Intra-op Plan:   Post-operative Plan: Extubation in OR  Informed Consent: I have reviewed the patients History and Physical, chart, labs and discussed the procedure including the risks, benefits and alternatives for the proposed anesthesia with the patient or authorized representative who has indicated his/her understanding and acceptance.   Dental advisory given  Plan Discussed with: CRNA, Anesthesiologist and Surgeon  Anesthesia Plan Comments:         Anesthesia Quick Evaluation

## 2013-01-08 ENCOUNTER — Encounter (HOSPITAL_COMMUNITY): Payer: Self-pay | Admitting: Obstetrics and Gynecology

## 2013-06-27 ENCOUNTER — Other Ambulatory Visit (HOSPITAL_COMMUNITY): Payer: Self-pay | Admitting: Obstetrics and Gynecology

## 2013-06-27 DIAGNOSIS — N809 Endometriosis, unspecified: Secondary | ICD-10-CM

## 2013-07-02 ENCOUNTER — Ambulatory Visit (HOSPITAL_COMMUNITY)
Admission: RE | Admit: 2013-07-02 | Discharge: 2013-07-02 | Disposition: A | Discharge status: Home / Self Care | Payer: BC Managed Care – PPO | Source: Ambulatory Visit | Attending: Obstetrics and Gynecology | Admitting: Obstetrics and Gynecology

## 2013-07-02 DIAGNOSIS — N809 Endometriosis, unspecified: Secondary | ICD-10-CM | POA: Insufficient documentation

## 2013-07-02 MED ORDER — IOHEXOL 300 MG/ML  SOLN
20.0000 mL | Freq: Once | INTRAMUSCULAR | Status: AC | PRN
  Administered 2013-07-02: 20 mL

## 2014-01-30 ENCOUNTER — Encounter (HOSPITAL_COMMUNITY): Payer: Self-pay | Admitting: Obstetrics and Gynecology

## 2014-04-25 IMAGING — RF DG HYSTEROGRAM
7 series · 9 of 9 positions shown · non-contrast
Comparison: None.

CLINICAL DATA: Endometriosis.

EXAM:
HYSTEROSALPINGOGRAM
TECHNIQUE: Hysterosalpingogram was performed by the ordering physician under
fluoroscopy. Fluoroscopic images were submitted for radiologic
interpretation following the procedure. Please see the procedural
report for the amount of contrast and the fluoroscopy time utilized.

[Series 1: run · 3 of 3 slices shown (1 of 7)]
[im 1/3]
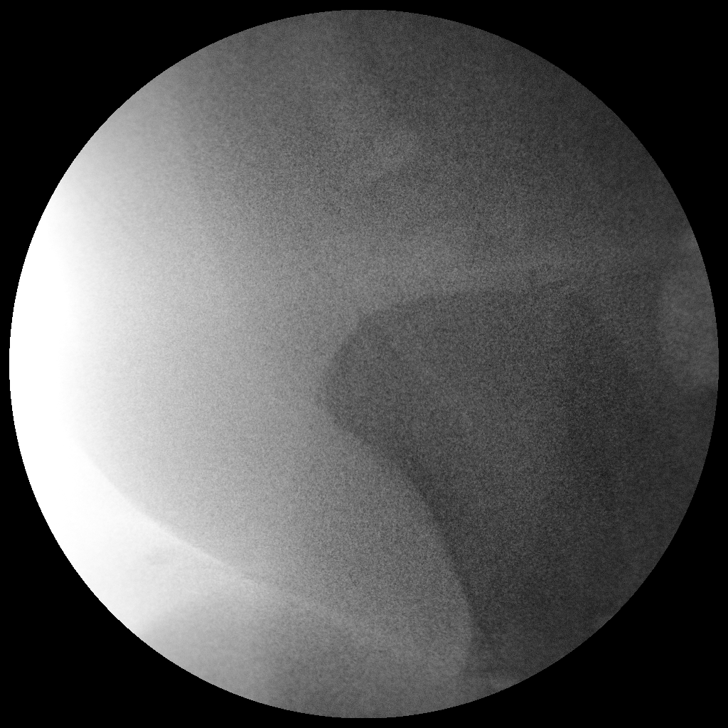
[im 2/3]
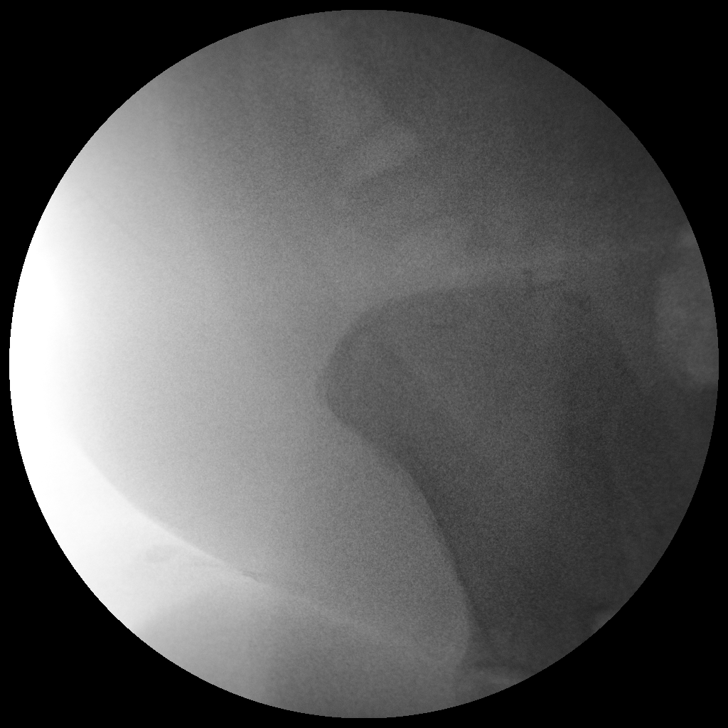
[im 3/3]
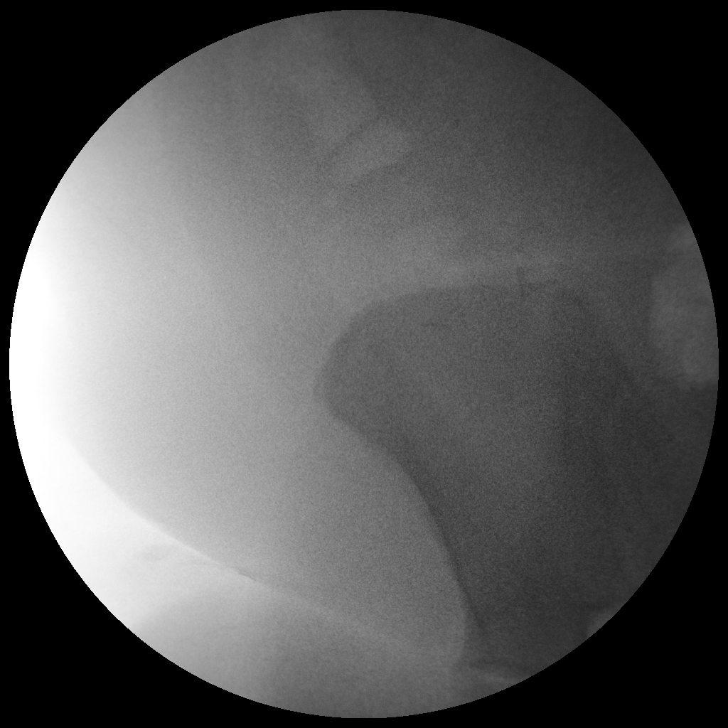

[Series 2: run · 1 of 1 slices shown (2 of 7)]
[im 1/1]
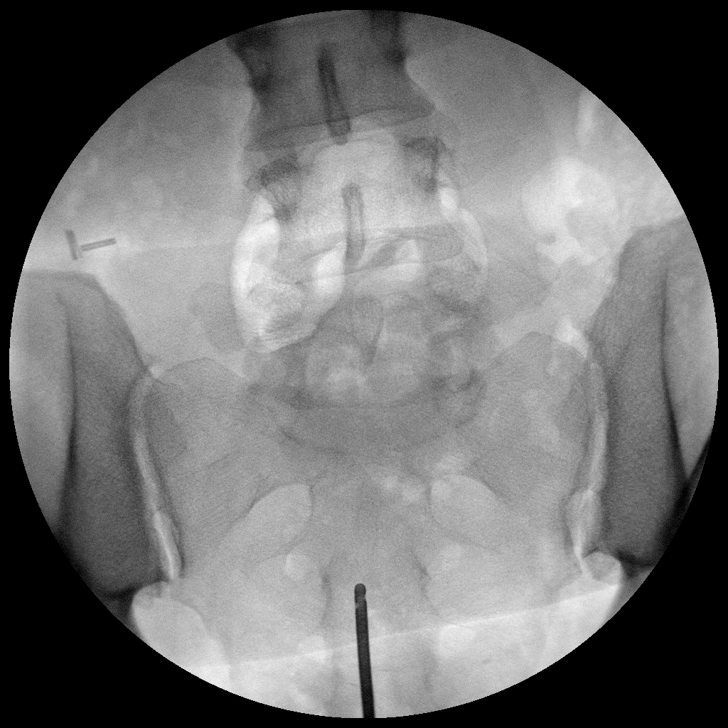

[Series 3: run · 1 of 1 slices shown (3 of 7)]
[im 1/1]
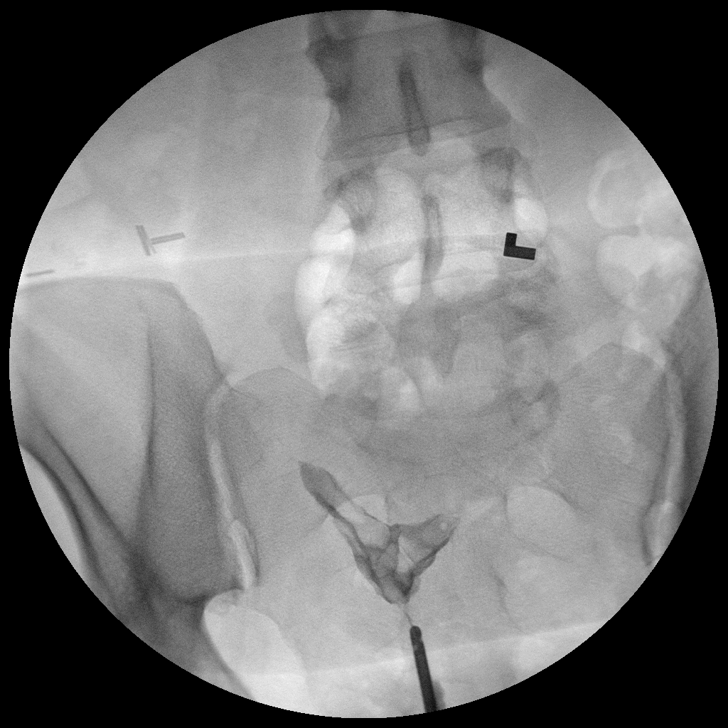

[Series 4: run · 1 of 1 slices shown (4 of 7)]
[im 1/1]
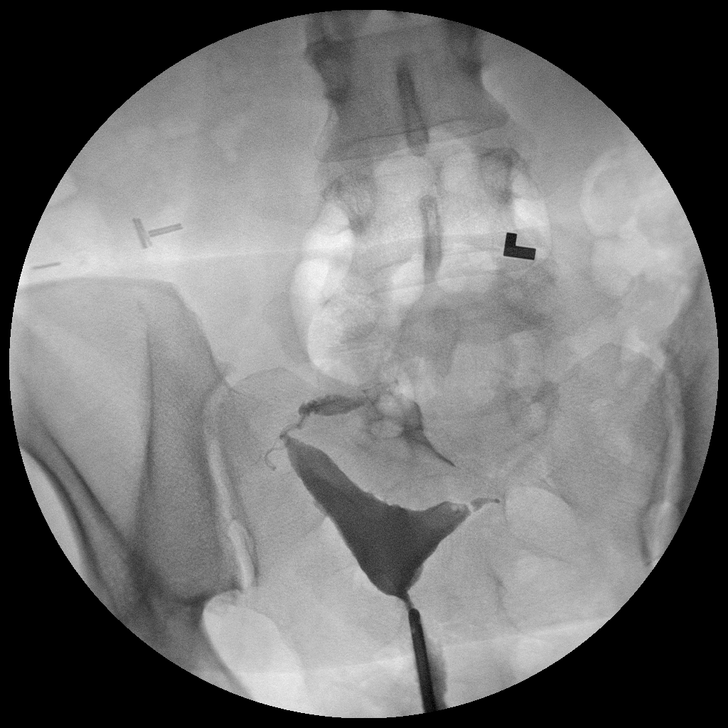

[Series 5: run · 1 of 1 slices shown (5 of 7)]
[im 1/1]
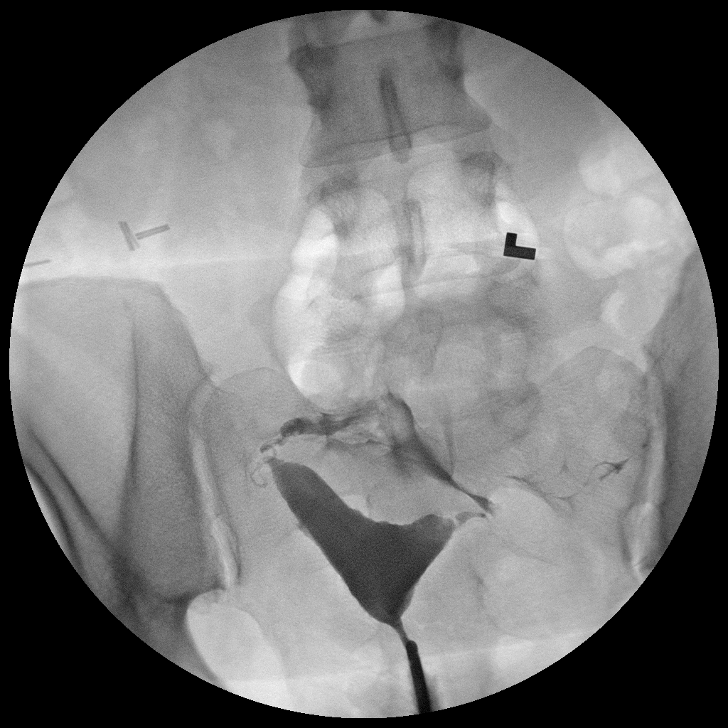

[Series 6: run · 1 of 1 slices shown (6 of 7)]
[im 1/1]
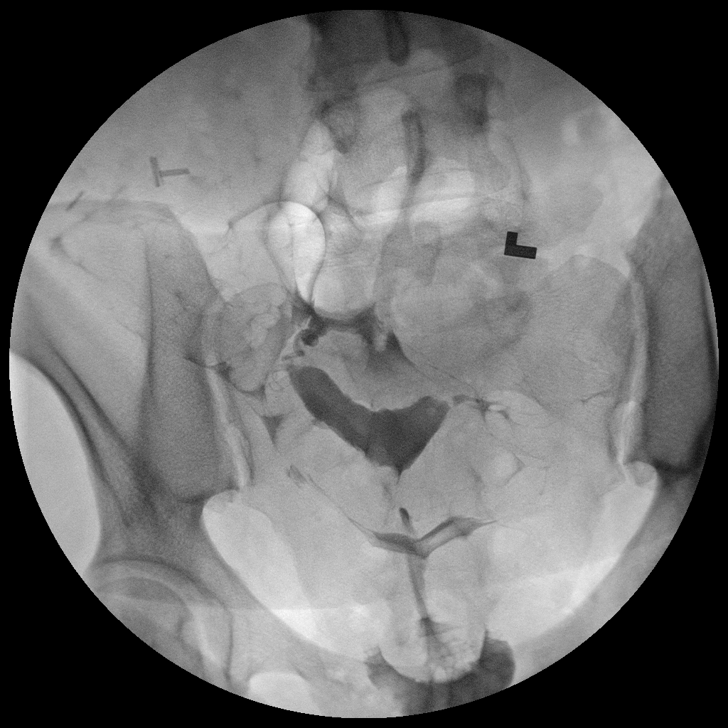

[Series 7: run · 1 of 1 slices shown (7 of 7)]
[im 1/1]
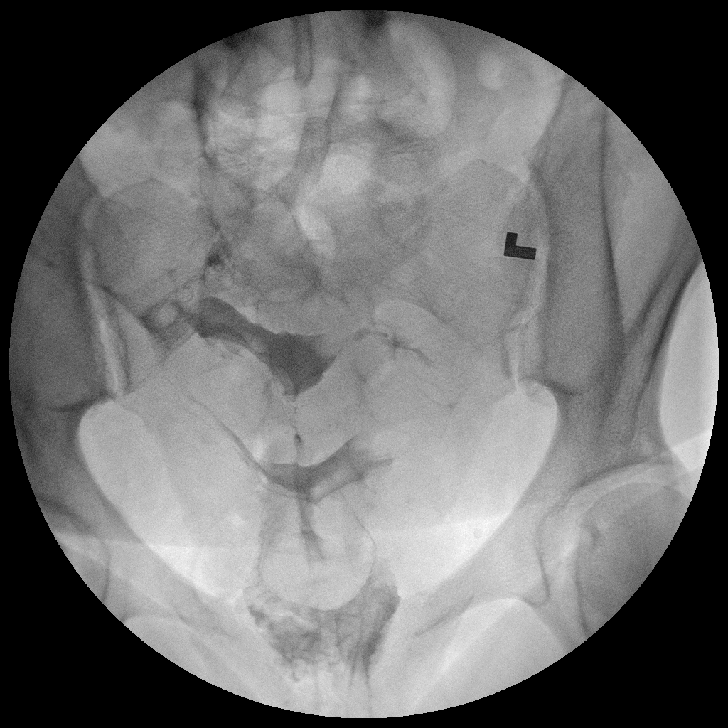

[9 of 9 positions shown; findings below may reference images not displayed]

FINDINGS: The endometrial cavity is uterus is normal in shape. Somewhat
thickened and nodular appearance of the endometrial lining is noted,
which may be due to the phase of the patient's menstrual cycle or
hormonal status.

Opacification of both fallopian tubes is seen. Intraperitoneal spill
of contrast is demonstrated, which appears to arise from both
fallopian tubes.
IMPRESSION: Fallopian tubes are patent.

Thickened nodular appearance of endometrial lining, which may be due
to the phase of the patient's menstrual cycle or hormonal status;
clinical correlation recommended.

## 2014-09-20 ENCOUNTER — Emergency Department (HOSPITAL_BASED_OUTPATIENT_CLINIC_OR_DEPARTMENT_OTHER)
Admission: EM | Admit: 2014-09-20 | Discharge: 2014-09-20 | Discharge status: Against Medical Advice | Payer: BC Managed Care – PPO | Attending: Emergency Medicine | Admitting: Emergency Medicine

## 2014-09-20 ENCOUNTER — Emergency Department (HOSPITAL_BASED_OUTPATIENT_CLINIC_OR_DEPARTMENT_OTHER): Payer: BC Managed Care – PPO

## 2014-09-20 ENCOUNTER — Encounter (HOSPITAL_BASED_OUTPATIENT_CLINIC_OR_DEPARTMENT_OTHER): Payer: Self-pay | Admitting: *Deleted

## 2014-09-20 DIAGNOSIS — J189 Pneumonia, unspecified organism: Secondary | ICD-10-CM | POA: Insufficient documentation

## 2014-09-20 DIAGNOSIS — Z79899 Other long term (current) drug therapy: Secondary | ICD-10-CM | POA: Diagnosis not present

## 2014-09-20 DIAGNOSIS — J209 Acute bronchitis, unspecified: Secondary | ICD-10-CM | POA: Insufficient documentation

## 2014-09-20 DIAGNOSIS — I10 Essential (primary) hypertension: Secondary | ICD-10-CM | POA: Diagnosis not present

## 2014-09-20 DIAGNOSIS — R000 Tachycardia, unspecified: Secondary | ICD-10-CM

## 2014-09-20 DIAGNOSIS — R509 Fever, unspecified: Secondary | ICD-10-CM | POA: Diagnosis present

## 2014-09-20 DIAGNOSIS — R05 Cough: Secondary | ICD-10-CM

## 2014-09-20 DIAGNOSIS — J40 Bronchitis, not specified as acute or chronic: Secondary | ICD-10-CM

## 2014-09-20 DIAGNOSIS — R059 Cough, unspecified: Secondary | ICD-10-CM

## 2014-09-20 DIAGNOSIS — Z8742 Personal history of other diseases of the female genital tract: Secondary | ICD-10-CM | POA: Insufficient documentation

## 2014-09-20 MED ORDER — IBUPROFEN 800 MG PO TABS
800.0000 mg | ORAL_TABLET | Freq: Once | ORAL | Status: AC
  Administered 2014-09-20: 800 mg via ORAL
  Filled 2014-09-20: qty 1

## 2014-09-20 MED ORDER — SODIUM CHLORIDE 0.9 % IV BOLUS (SEPSIS): 1000.0000 mL | Freq: Once | INTRAVENOUS | Status: DC

## 2014-09-20 MED ORDER — BENZONATATE 100 MG PO CAPS: 100.0000 mg | ORAL_CAPSULE | Freq: Three times a day (TID) | ORAL | Status: AC

## 2014-09-20 MED ORDER — LEVOFLOXACIN 500 MG PO TABS: 500.0000 mg | ORAL_TABLET | Freq: Every day | ORAL | Status: AC

## 2014-09-20 MED ORDER — LEVOFLOXACIN 500 MG PO TABS
500.0000 mg | ORAL_TABLET | Freq: Once | ORAL | Status: AC
  Administered 2014-09-20: 500 mg via ORAL
  Filled 2014-09-20: qty 1

## 2014-09-20 MED ORDER — ALBUTEROL SULFATE HFA 108 (90 BASE) MCG/ACT IN AERS
2.0000 | INHALATION_SPRAY | RESPIRATORY_TRACT | Status: DC | PRN
  Administered 2014-09-20: 2 via RESPIRATORY_TRACT
  Filled 2014-09-20: qty 6.7

## 2014-09-20 MED ORDER — ACETAMINOPHEN 500 MG PO TABS
1000.0000 mg | ORAL_TABLET | Freq: Once | ORAL | Status: AC
  Administered 2014-09-20: 1000 mg via ORAL
  Filled 2014-09-20: qty 2

## 2014-09-20 MED ORDER — ALBUTEROL SULFATE (2.5 MG/3ML) 0.083% IN NEBU
5.0000 mg | INHALATION_SOLUTION | Freq: Once | RESPIRATORY_TRACT | Status: AC
  Administered 2014-09-20: 5 mg via RESPIRATORY_TRACT
  Filled 2014-09-20: qty 6

## 2014-09-20 NOTE — Discharge Instructions (Signed)
Return to the ED with any concerns including difficulty breathing, vomiting and not able to keep down fluids or medications, fainting, decreased level of alertness/lethargy, or any other alarming symptoms  You should use the albuterol inhaler 2 puffs every 4 hours

## 2014-09-20 NOTE — ED Provider Notes (Signed)
CSN: [id number]     Arrival date & time 09/20/14  1610 History   First MD Initiated Contact with Patient 09/20/14 1740     Chief Complaint  Patient presents with  . Fever     (Consider location/radiation/quality/duration/timing/severity/associated sxs/prior Treatment) HPI  Pt presents with c/o cough and fever.  She states symptoms started earlier today.  Last night she may have felt subjective low grade fever.  No vomiting or diarrhea.  No sore throat.  Symptoms are constant.  Cough is nonproductive.  She has continued drinking liquids normally.  No chest pain.  Symptoms are moderate.  There are no other associated systemic symptoms, there are no other alleviating or modifying factors.   Past Medical History  Diagnosis Date  . PONV (postoperative nausea and vomiting)   . Seasonal allergies   . Hypertension   . History of infertility, female    Past Surgical History  Procedure Laterality Date  . Bladder stretched      as young child  . Appendectomy    . Diagnostic laparoscopy    . Left knee surgery    . Breast surgery      reduction  . Laparoscopy  10/13/2012    Procedure: LAPAROSCOPY OPERATIVE;  Surgeon: Lenoard Adenichard J Taavon, MD;  Location: [facility];  Service: Gynecology;  Laterality: N/A;  . Robotic assisted laparoscopic ovarian cystectomy  10/13/2012    Procedure: ROBOTIC ASSISTED LAPAROSCOPIC OVARIAN CYSTECTOMY;  Surgeon: Lenoard Adenichard J Taavon, MD;  Location: [facility];  Service: Gynecology;  Laterality: N/A;  . Robotic assisted laparoscopic lysis of adhesion  10/13/2012    Procedure: ROBOTIC ASSISTED LAPAROSCOPIC LYSIS OF ADHESION;  Surgeon: Lenoard Adenichard J Taavon, MD;  Location: [facility];  Service: Gynecology;  Laterality: N/A;  . Ablation on endometriosis  10/13/2012    Procedure: ABLATION ON ENDOMETRIOSIS;  Surgeon: Lenoard Adenichard J Taavon, MD;  Location: [facility];  Service: Gynecology;  Laterality: N/A;  . Laparoscopic unilateral salpingectomy Left 01/05/2013    Procedure: LAPAROSCOPIC UNILATERAL  SALPINGECTOMY;  Surgeon: Fermin Schwabamer Yalcinkaya, MD;  Location: [facility];  Service: Gynecology;  Laterality: Left;  . Hysteroscopy N/A 01/05/2013    Procedure: HYSTEROSCOPY;  Surgeon: Fermin Schwabamer Yalcinkaya, MD;  Location: [facility];  Service: Gynecology;  Laterality: N/A;  with Resection of Uterine Septum  . Chromopertubation Right 01/05/2013    Procedure: CHROMOPERTUBATION;  Surgeon: Fermin Schwabamer Yalcinkaya, MD;  Location: [facility];  Service: Gynecology;  Laterality: Right;  . Dilation and evacuation N/A 01/05/2013    Procedure: DILATATION AND EVACUATION;  Surgeon: Fermin Schwabamer Yalcinkaya, MD;  Location: [facility];  Service: Gynecology;  Laterality: N/A;  . Knee surgery     No family history on file. History  Substance Use Topics  . Smoking status: Never Smoker   . Smokeless tobacco: Never Used  . Alcohol Use: Yes     Comment: occasional   OB History    No data available     Review of Systems  ROS reviewed and all otherwise negative except for mentioned in HPI    Allergies  Codeine and Hydrocodone  Home Medications   Prior to Admission medications   Medication Sig Start Date End Date Taking? Authorizing Provider  Ascorbic Acid (VITAMIN C PO) Take 500 mg by mouth 2 (two) times daily.    Yes Historical Provider, MD  Calcium Citrate-Vitamin D (CALCIUM CITRATE + PO) Take 1 tablet by mouth daily.   Yes Historical Provider, MD  cetirizine (ZYRTEC) 10 MG tablet Take 10 mg by mouth at bedtime.   Yes  Historical Provider, MD  fish oil-omega-3 fatty acids 1000 MG capsule Take 1 g by mouth 2 (two) times daily.   Yes Historical Provider, MD  hydrochlorothiazide (MICROZIDE) 12.5 MG capsule Take 12.5 mg by mouth daily.   Yes Historical Provider, MD  labetalol (NORMODYNE) 100 MG tablet Take 100 mg by mouth 2 (two) times daily.   Yes Historical Provider, MD  Multiple Vitamin (MULTIVITAMIN WITH MINERALS) TABS Take 1 tablet by mouth daily.   Yes Historical Provider, MD  benzonatate (TESSALON) 100 MG capsule Take 1 capsule (100 mg  total) by mouth every 8 (eight) hours. 09/20/14   Ethelda ChickMartha K Linker, MD  HYDROmorphone (DILAUDID) 2 MG tablet Take 1 tablet (2 mg total) by mouth every 4 (four) hours as needed for pain. 01/05/13   Fermin Schwabamer Yalcinkaya, MD  levofloxacin (LEVAQUIN) 500 MG tablet Take 1 tablet (500 mg total) by mouth daily. 09/20/14   Ethelda ChickMartha K Linker, MD   BP 180/91 mmHg  Pulse 127  Temp(Src) 98.9 F (37.2 C) (Oral)  Resp 18  Ht 5\' 1"  (1.549 m)  Wt 185 lb (83.915 kg)  BMI 34.97 kg/m2  SpO2 97%  LMP 09/20/2014  Vitals reviewed Physical Exam  Physical Examination: General appearance - alert, well appearing, and in no distress Mental status - alert, oriented to person, place, and time Eyes - no conjunctival injection, no scleral icterus Mouth - mucous membranes moist, pharynx normal without lesions Chest - clear to auscultation, no wheezes, rales or rhonchi, symmetric air entry, normal respiratory effort, frequent coughing Heart - normal rate, regular rhythm, normal S1, S2, no murmurs, rubs, clicks or gallops Abdomen - soft, nontender, nondistended, no masses or organomegaly Extremities - peripheral pulses normal, no pedal edema, no clubbing or cyanosis Skin - normal coloration and turgor, no rashes  ED Course  Procedures (including critical care time)  8:59 PM persistent tachycardia after tylenol and ibuprofen , may be related to albuterol but d/w patient that I would like to start IV and check labs for anemia etc.  She requests to not have these tests done and would like to be discharged.  Advised that she is welcome to return at any time if she would like.  Will give rx for steroids and will give albuterol inhaler for home use as well as cough syrup Labs Review Labs Reviewed - No data to display  Imaging Review No results found.   EKG Interpretation None      MDM   Final diagnoses:  Cough  Bronchitis  Tachycardia  Pneumonia involving right lung, unspecified part of lung    Pt presenting with  cough and fever, CXR shows likely pneumonia- pt started on abx in the ED.  After afebrile due to antipyretics pt remains tachycardic.  D/w patient as above about doing fluids and lab work- she does not wish to stay for this and would like to be discharged.  See note above. Discharged with strict return precautions.  Pt agreeable with plan.    Ethelda ChickMartha K Linker, MD 09/22/14 [phone]

## 2014-09-20 NOTE — Patient Instructions (Signed)
Instructed patient on the proper use of administering albuterol mdi via aerochamber patient tolerated well 

## 2014-09-20 NOTE — ED Notes (Signed)
Cough and fever x 1 day

## 2014-09-20 NOTE — ED Notes (Signed)
MD at bedside. 

## 2014-09-20 NOTE — ED Notes (Signed)
Pt refused iv and lab work,  md talked to pt

## 2015-07-14 IMAGING — CR DG CHEST 2V
2 series · 2 of 2 positions shown · non-contrast
Comparison: None.

CLINICAL DATA: Cough yesterday with fever beginning last night.

EXAM:
CHEST  2 VIEW

[w chest pa]
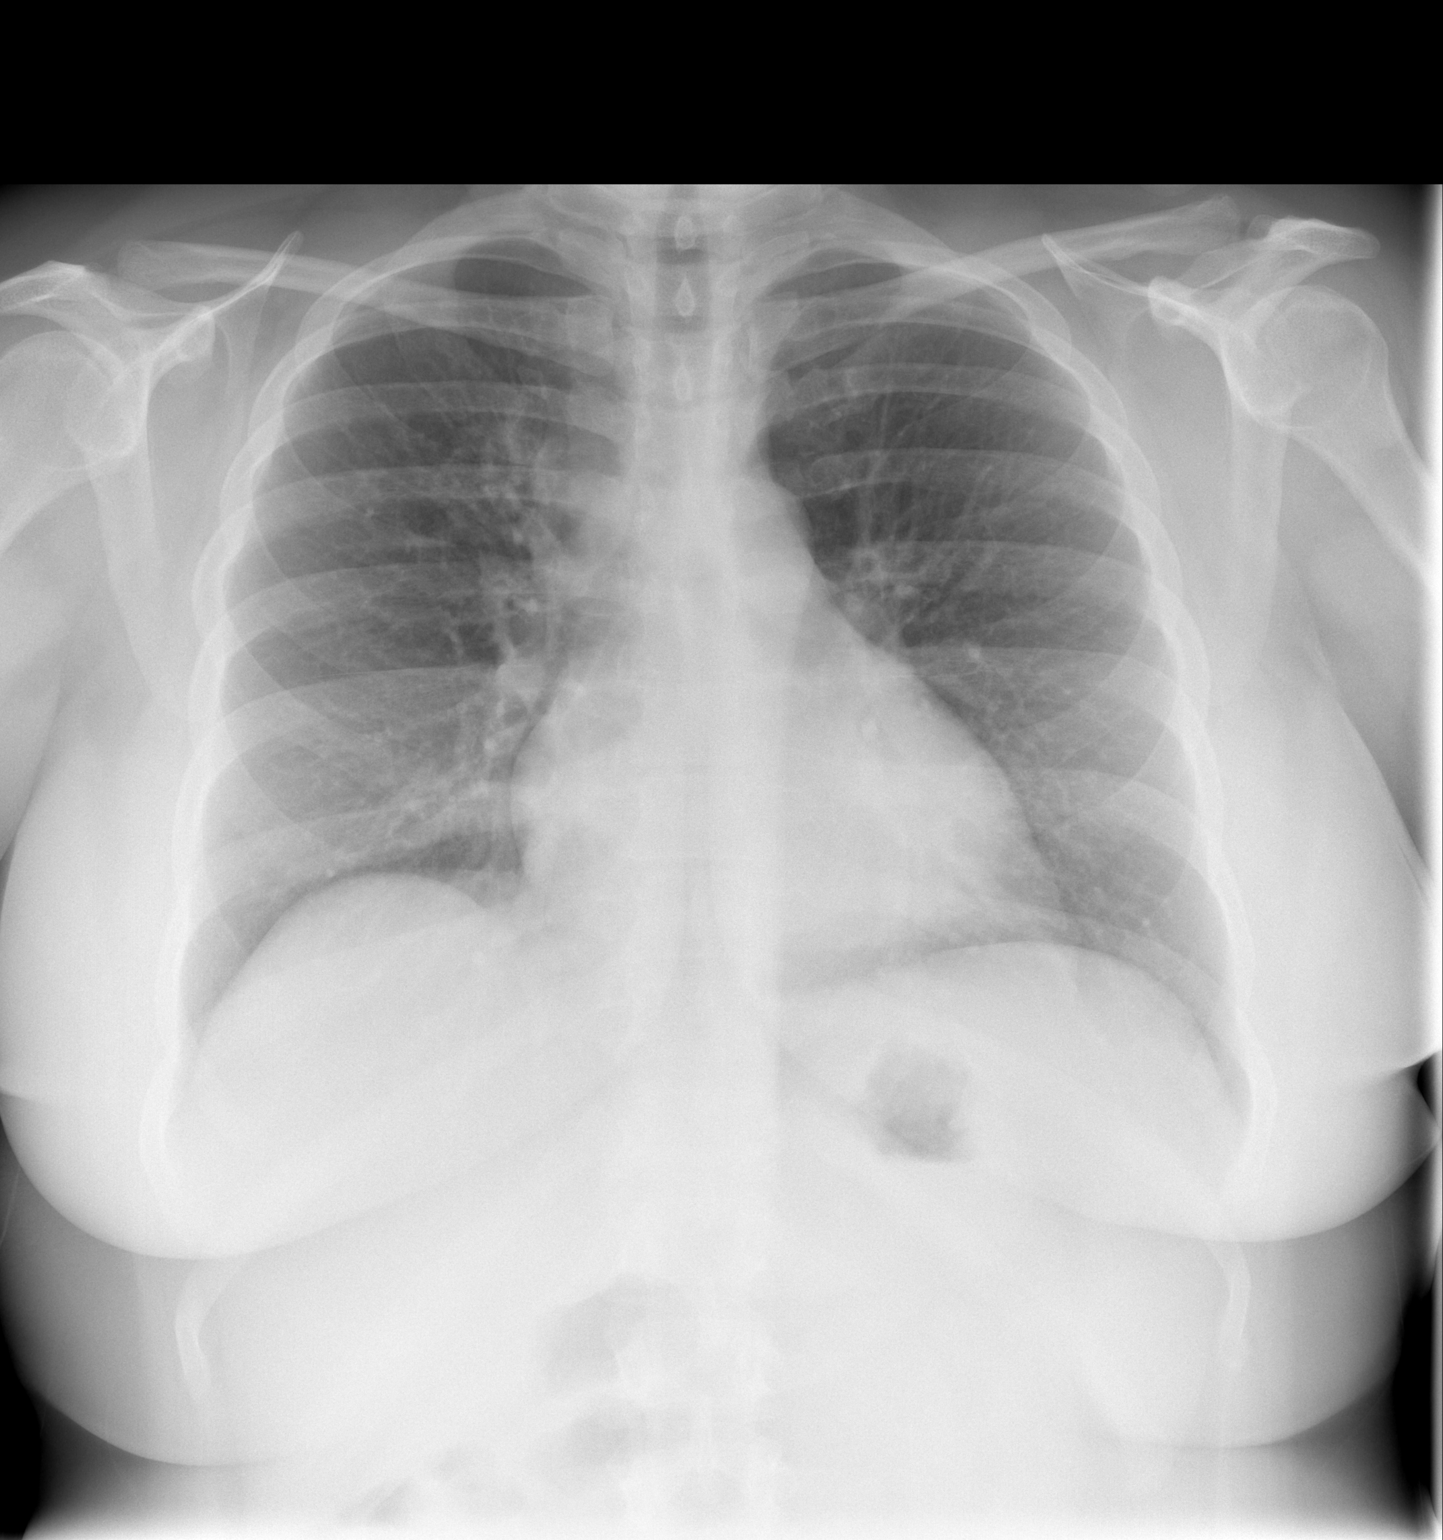

[w chest lat *]
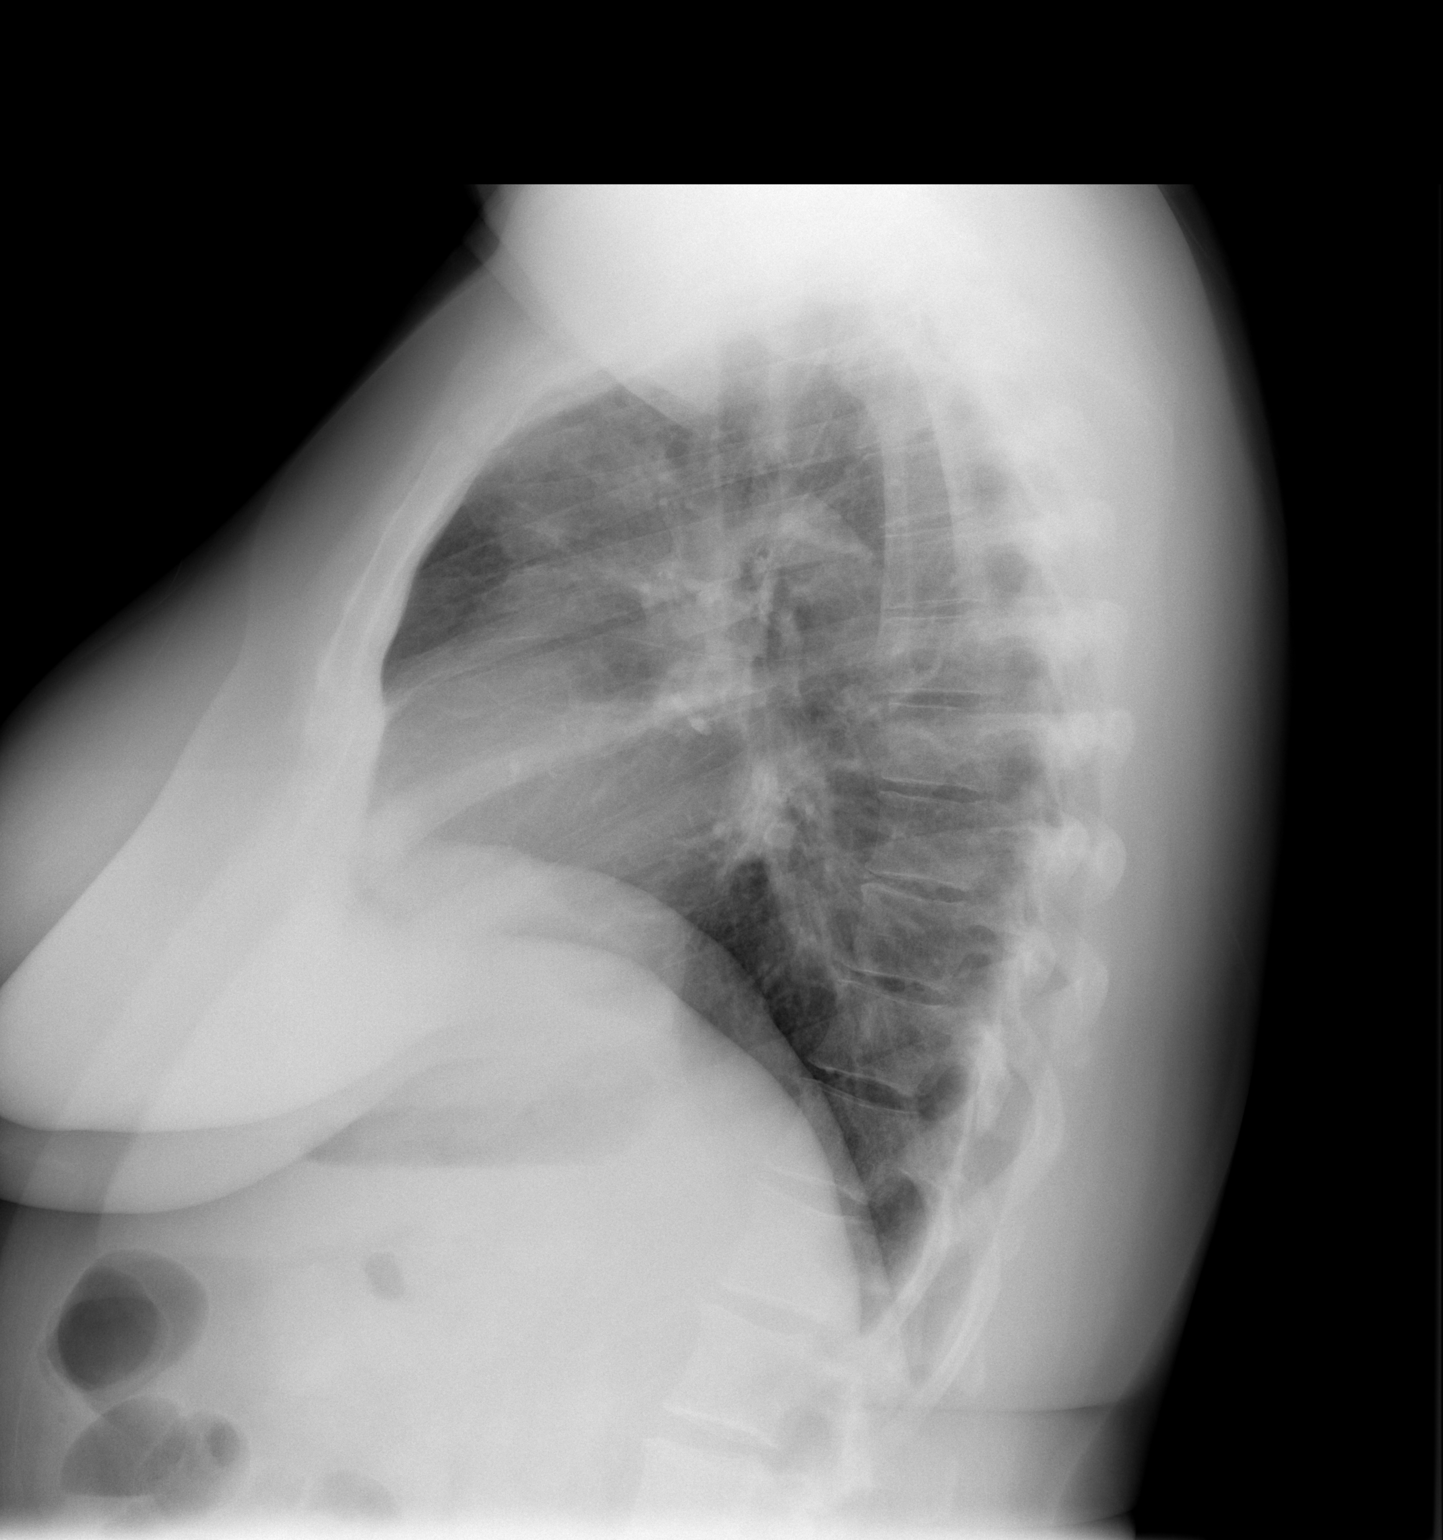

[2 of 2 positions shown; findings below may reference images not displayed]

FINDINGS: Lungs are adequately inflated as there is mild opacification over
the medial right upper lobe. No evidence of effusion.
Cardiomediastinal silhouette and remainder of the exam is
unremarkable.
IMPRESSION: Opacification over the anterior medial right upper lobe as cannot
exclude infection. Recommend follow-up to resolution.

## 2022-03-25 ENCOUNTER — Inpatient Hospital Stay: Admit: 2022-03-26 | Payer: BLUE CROSS/BLUE SHIELD

## 2022-03-25 ENCOUNTER — Inpatient Hospital Stay
Admission: EM | Admit: 2022-03-25 | Discharge: 2022-03-29 | Disposition: A | Discharge status: Home / Self Care | Payer: BLUE CROSS/BLUE SHIELD | Admitting: Internal Medicine

## 2022-03-25 DIAGNOSIS — I63532 Cerebral infarction due to unspecified occlusion or stenosis of left posterior cerebral artery: Secondary | ICD-10-CM

## 2022-03-25 LAB — POCT URINALYSIS DIPSTICK
Bilirubin, Urine: NEGATIVE
Blood, Urine: NEGATIVE
Glucose, Ur: 500 mg/dl — AB
Ketones, Urine: 15 mg/dl — AB
Leukocyte Esterase, Urine: NEGATIVE
Nitrite, Urine: NEGATIVE
Protein, Urine: 30 mg/dl — AB
Specific Gravity, Urine: 1.025 (ref 1.005–1.030)
Urobilinogen, Urine: 0.2 EU/dl (ref 0.0–1.0)
pH, Urine: 5.5 (ref 5–9)

## 2022-03-25 LAB — CBC WITH AUTO DIFFERENTIAL
Basophils: 0.2 % (ref 0–3)
Eosinophils: 0 % (ref 0–5)
Hematocrit: 40.7 % (ref 35.0–47.0)
Hemoglobin: 13.1 gm/dl (ref 11.0–16.0)
Immature Granulocytes: 0.2 % (ref 0.0–3.0)
Lymphocytes: 12.4 % — ABNORMAL LOW (ref 28–48)
MCH: 24.7 pg — ABNORMAL LOW (ref 25.4–34.6)
MCHC: 32.2 gm/dl (ref 30.0–36.0)
MCV: 76.8 fL — ABNORMAL LOW (ref 80.0–98.0)
MPV: 9.3 fL (ref 6.0–10.0)
Monocytes: 1.4 % (ref 1–13)
Neutrophils Segmented: 85.8 % — ABNORMAL HIGH (ref 34–64)
Nucleated RBCs: 0 (ref 0–0)
Platelets: 500 10*3/uL — ABNORMAL HIGH (ref 140–450)
RBC: 5.3 M/uL — ABNORMAL HIGH (ref 3.60–5.20)
RDW: 39.2 (ref 36.4–46.3)
WBC: 10.5 10*3/uL (ref 4.0–11.0)

## 2022-03-25 LAB — POC CHEM 8
BUN: 15 mg/dl (ref 7–25)
Calcium, Ionized: 4.7 mg/dL (ref 4.40–5.40)
Chloride: 104 mEq/L (ref 98–107)
Creatinine: 0.5 mg/dl — ABNORMAL LOW (ref 0.6–1.3)
Glucose: 369 mg/dL — ABNORMAL HIGH (ref 74–106)
Hematocrit: 43 % (ref 38–45)
Hemoglobin: 14.6 gm/dl (ref 13.0–17.2)
Potassium: 3.9 mEq/L (ref 3.5–4.9)
Sodium: 136 mEq/L (ref 136–145)
Total CO2: 18 mmol/L — ABNORMAL LOW (ref 21–32)

## 2022-03-25 LAB — COMPREHENSIVE METABOLIC PANEL
ALT: 28 U/L (ref 10–49)
AST: 17 U/L (ref 0.0–33.9)
Albumin: 3.6 gm/dl (ref 3.4–5.0)
Alkaline Phosphatase: 141 U/L — ABNORMAL HIGH (ref 46–116)
Anion Gap: 19 mmol/L — ABNORMAL HIGH (ref 5–15)
BUN: 14 mg/dl (ref 9–23)
CO2: 17 mEq/L — ABNORMAL LOW (ref 20–31)
Calcium: 10.6 mg/dl — ABNORMAL HIGH (ref 8.7–10.4)
Chloride: 101 mEq/L (ref 98–107)
Creatinine: 0.92 mg/dl (ref 0.55–1.02)
GFR African American: >60
GFR Non-African American: >60
Glucose: 355 mg/dl — ABNORMAL HIGH (ref 74–106)
Potassium: 3.9 mEq/L (ref 3.5–5.1)
Sodium: 137 mEq/L (ref 136–145)
Total Bilirubin: 0.4 mg/dl (ref 0.30–1.20)
Total Protein: 8.8 gm/dl — ABNORMAL HIGH (ref 5.7–8.2)

## 2022-03-25 LAB — POC CG4:BLOOD GAS,LACTATE
Base Excess: -9 mmol/L — ABNORMAL LOW (ref <=3)
HCO3: 16.4 mmol/L — ABNORMAL LOW (ref 18.0–26.0)
Lactate: 0.91 mmol/L (ref 0.40–2.00)
O2 Sat: 92 % (ref 90–100)
PCO2: 29.7 mm Hg — ABNORMAL LOW (ref 35.0–45.0)
PO2: 65 mm Hg — ABNORMAL LOW (ref 75–100)
Ph: 7.35 (ref 7.350–7.450)
Pt Temp: 98.2
Total CO2: 17 mmol/L — ABNORMAL LOW (ref 24–29)

## 2022-03-25 LAB — POCT GLUCOSE
POC Glucose: 329 mg/dL — ABNORMAL HIGH (ref 65–105)
POC Glucose: 340 mg/dL — ABNORMAL HIGH (ref 65–105)
POC Glucose: 359 mg/dL — ABNORMAL HIGH (ref 65–105)

## 2022-03-25 LAB — PROTIME-INR
INR: 1.1 (ref 0.1–1.1)
Protime: 13.1 seconds — ABNORMAL HIGH (ref 10.2–12.9)

## 2022-03-25 MED ORDER — HEPARIN SODIUM (PORCINE) 5000 UNIT/ML IJ SOLN
5000 UNIT/ML | Freq: Three times a day (TID) | INTRAMUSCULAR | Status: DC
Start: 2022-03-25 — End: 2022-03-25

## 2022-03-25 MED ORDER — LABETALOL HCL 5 MG/ML IV SOLN
5 MG/ML | INTRAVENOUS | Status: AC
Start: 2022-03-25 — End: 2022-03-25
  Administered 2022-03-25: 22:00:00 10 mg via INTRAVENOUS

## 2022-03-25 MED ORDER — LABETALOL HCL 5 MG/ML IV SOLN
5 MG/ML | INTRAVENOUS | Status: AC
Start: 2022-03-25 — End: 2022-03-26

## 2022-03-25 MED ORDER — INSULIN REGULAR(HUMAN) IN NACL 100-0.9 UT/100ML-% IV SOLN
100-0.9100- UT/100ML-% | INTRAVENOUS | Status: DC
Start: 2022-03-25 — End: 2022-03-26
  Administered 2022-03-25: 23:00:00 3 [IU]/h via INTRAVENOUS
  Administered 2022-03-26: 01:00:00 3.5 [IU]/h via INTRAVENOUS
  Administered 2022-03-26: 02:00:00 3.8 [IU]/h via INTRAVENOUS

## 2022-03-25 MED ORDER — SODIUM CHLORIDE 0.9 % IV BOLUS
0.9 % | Freq: Once | INTRAVENOUS | Status: DC
Start: 2022-03-25 — End: 2022-03-25

## 2022-03-25 MED ORDER — GLUCAGON HCL RDNA (DIAGNOSTIC) 1 MG IJ SOLR
1 MG | INTRAMUSCULAR | Status: AC | PRN
Start: 2022-03-25 — End: 2022-03-29

## 2022-03-25 MED ORDER — ASPIRIN 325 MG PO TABS
325 MG | Freq: Every day | ORAL | Status: DC
Start: 2022-03-25 — End: 2022-03-25
  Administered 2022-03-25: 22:00:00 325 mg via ORAL

## 2022-03-25 MED ORDER — SODIUM CHLORIDE 0.9 % IV SOLN
0.9 % | INTRAVENOUS | Status: AC
Start: 2022-03-25 — End: 2022-03-26
  Administered 2022-03-25: 23:00:00 via INTRAVENOUS

## 2022-03-25 MED ORDER — DEXTROSE 50 % IV SOLN
50 % | INTRAVENOUS | Status: DC | PRN
Start: 2022-03-25 — End: 2022-03-29

## 2022-03-25 MED ORDER — SODIUM CHLORIDE 0.9 % IV SOLN
0.9 % | INTRAVENOUS | Status: DC
Start: 2022-03-25 — End: 2022-03-25

## 2022-03-25 MED ORDER — ONDANSETRON HCL 4 MG/2ML IJ SOLN
4 MG/2ML | Freq: Once | INTRAMUSCULAR | Status: AC
Start: 2022-03-25 — End: 2022-03-25
  Administered 2022-03-25: 22:00:00 4 mg via INTRAVENOUS

## 2022-03-25 MED ORDER — ATORVASTATIN CALCIUM 40 MG PO TABS
40 MG | Freq: Every evening | ORAL | Status: AC
Start: 2022-03-25 — End: 2022-03-27
  Administered 2022-03-27: 01:00:00 40 mg via ORAL

## 2022-03-25 MED FILL — LABETALOL HCL 5 MG/ML IV SOLN: 5 MG/ML | INTRAVENOUS | Qty: 4

## 2022-03-25 MED FILL — MYXREDLIN 100-0.9 UT/100ML-% IV SOLN: INTRAVENOUS | Qty: 100

## 2022-03-25 MED FILL — ONDANSETRON HCL 4 MG/2ML IJ SOLN: 4 MG/2ML | INTRAMUSCULAR | Qty: 2

## 2022-03-25 MED FILL — ASPIRIN 325 MG PO TABS: 325 MG | ORAL | Qty: 1

## 2022-03-25 NOTE — ED Notes (Signed)
Bedside and Verbal shift change report given to Lowella Bandy, RN, BSN (Cabin crew) by Karen Kitchens, RN (offgoing nurse). Report included the following information Nurse Handoff Report, ED Encounter Summary, ED SBAR, Intake/Output, MAR, Recent Results, and Cardiac Rhythm Sinus .        Lowella Bandy, RN  03/25/22 1920

## 2022-03-25 NOTE — ED Notes (Signed)
TRANSFER - OUT REPORT:    Verbal report given to Ladona Ridgel, RN on Barbara Doyle being transferred to ICU for routine progression of patient care       Report consisted of patient's Situation, Background, Assessment and   Recommendations(SBAR).     Information from the following report(s) Nurse Handoff Report, ED Encounter Summary, ED SBAR, Intake/Output, MAR, Recent Results, and Cardiac Rhythm Sinus  was reviewed with the receiving nurse.  Kinder Assessment: Presents to emergency department  because of falls (Syncope, seizure, or loss of consciousness): No, Age > 70: No, Altered Mental Status, Intoxication with alcohol or substance confusion (Disorientation, impaired judgment, poor safety awaremess, or inability to follow instructions): Yes, Impaired Mobility: Ambulates or transfers with assistive devices or assistance; Unable to ambulate or transer.: Yes, Nursing Judgement: Yes  Lines:   Peripheral IV 03/25/22 Right Antecubital (Active)       Peripheral IV 03/25/22 Distal;Left;Anterior Forearm (Active)      Medications sent with patient from pharmacy: None to send  Patient belongings: Belongings sent to floor    Opportunity for questions and clarification was provided.      Patient transported with:  Monitor and Registered Nurse           Lowella Bandy, RN  03/25/22 2204

## 2022-03-25 NOTE — Consults (Signed)
Neurology Consult Note    Patient: Barbara Doyle Age: 46 y.o. Sex: female    Date of Birth: 31-Dec-1975 Admit Date: 03/25/2022 PCP: None None   MRN: [id number]  CSN: [id number]       Date: March 25, 2022     Reason for Neurology Consultation:   L PICA stroke      Assessment:  23F transferred from [facility] with acute vestibular syndrome (room spinning counter clockwise with associated nausea/vomiting), found to have hyperglycemia and an acute left cerebellar stroke, PICA distribution (images unavailable to view at this time).  BP elevated to 190/120 in the ER, given Labetalol 10mg  x 1.  NIHSS actually 0 with no overt cerebellar signs.    Plan:  Asa 325mg  x1, then 81mg /d  Lipitor 40mg /d  CT/CTA head & neck  Q2h neuro checks  Telemetry  Echocardiogram      , D.O.  Neurohospitalist  [phone]  ___________________________________________________  History of Present Illness:   58F with history of prediabetes and anxiety transferred from the [facility] with an acute left PICA infarct.  Pt was there on vacation and felt nauseated on Tuesday.  This became markedly worse yesterday with frequent emesis and she presented to the hospital where she was hypertensive. Imaging notable for left cerebellar stroke.    Review of Systems:  10-point review of systems was reviewed with pertinent positives noted in the assessment & plan.    Physical Examination:    Vitals:    03/25/22 1711   BP:    Pulse: 94   Resp: 24   Temp:    SpO2: 93%       General:  Well nourished, mild distress  HEENT: Normocephalic, atraumatic   Neck: Supple,no cervical lymphadenopathy seen.    Heart: No JVD seen  Skin: No rash seen  Musculoskeletal: No deformity seen  Psych:  Mood and affect appropriate    Neurological Examination:     Mental Status:  Alert and oriented.  Appropriate mood and affect.    Language:  Converses appropriately.   Cranial Nerves:  PERRL, EOMI, no nystagmus, no facial asymmetry, sensation intact  Motor:  No pronator drift.  No  abnormal posturing. No hyperkinetic movements observed.  Reflexes:  Symmetric  Sensation:  Intact to light touch and pain  Coordination:  FTN intact. No gross ataxia.  Gait: Deferred          Current Facility-Administered Medications   Medication Dose Route Frequency    aspirin tablet 325 mg  325 mg Oral Daily    insulin regular (MYXREDLIN) 100 units in sodium chloride 0.9 % 100 ml infusion  0-100 Units/hr IntraVENous Continuous    dextrose 50 % IV solution  5-25 g IntraVENous PRN    glucagon (rDNA) injection 1 mg  1 mg IntraMUSCular PRN    atorvastatin (LIPITOR) tablet 40 mg  40 mg Oral Nightly    0.9 % sodium chloride infusion   IntraVENous Continuous    heparin (porcine) injection 5,000 Units  5,000 Units SubCUTAneous 3 times per day    labetalol (NORMODYNE;TRANDATE) 5 MG/ML injection         No current outpatient medications on file.     Allergies   Allergen Reactions    Codeine Itching, Nausea Only and Rash     Throat gets tight      Hydrocodone Itching and Rash     Throat gets tight       Past Medical History:   Diagnosis Date  Anxiety     Diabetes (HCC)     Endometriosis     Hypertension      Past Surgical History:   Procedure Laterality Date    APPENDECTOMY       Social History     Socioeconomic History    Marital status: Married     Spouse name: Not on file    Number of children: Not on file    Years of education: Not on file    Highest education level: Not on file   Occupational History    Not on file   Tobacco Use    Smoking status: Not on file    Smokeless tobacco: Not on file   Substance and Sexual Activity    Alcohol use: Not on file    Drug use: Not on file    Sexual activity: Not on file   Other Topics Concern    Not on file   Social History Narrative    Not on file     Social Determinants of Health     Financial Resource Strain: Not on file   Food Insecurity: Not on file   Transportation Needs: Not on file   Physical Activity: Not on file   Stress: Not on file   Social Connections: Not on file    Intimate Partner Violence: Not on file   Housing Stability: Not on file     History reviewed. No pertinent family history.    Lab Review   Recent Results (from the past 24 hour(s))   CBC with Auto Differential    Collection Time: 03/25/22  5:07 PM   Result Value Ref Range    WBC 10.5 4.0 - 11.0 1000/mm3    RBC 5.30 (H) 3.60 - 5.20 M/uL    Hemoglobin 13.1 11.0 - 16.0 gm/dl    Hematocrit 31.5 40.0 - 47.0 %    MCV 76.8 (L) 80.0 - 98.0 fL    MCH 24.7 (L) 25.4 - 34.6 pg    MCHC 32.2 30.0 - 36.0 gm/dl    Platelets 867 (H) 619 - 450 1000/mm3    MPV 9.3 6.0 - 10.0 fL    RDW 39.2 36.4 - 46.3      Nucleated RBCs 0 0 - 0      Immature Granulocytes 0.2 0.0 - 3.0 %    Neutrophils Segmented 85.8 (H) 34 - 64 %    Lymphocytes 12.4 (L) 28 - 48 %    Monocytes 1.4 1 - 13 %    Eosinophils 0.0 0 - 5 %    Basophils 0.2 0 - 3 %   CMP    Collection Time: 03/25/22  5:07 PM   Result Value Ref Range    Potassium 3.9 3.5 - 5.1 mEq/L    Chloride 101 98 - 107 mEq/L    Sodium 137 136 - 145 mEq/L    CO2 17 (L) 20 - 31 mEq/L    Glucose 355 (H) 74 - 106 mg/dl    BUN 14 9 - 23 mg/dl    Creatinine 5.09 3.26 - 1.02 mg/dl    GFR African American >60.0      GFR Non-African American >60      Calcium 10.6 (H) 8.7 - 10.4 mg/dl    Anion Gap 19 (H) 5 - 15 mmol/L    AST 17.0 0.0 - 33.9 U/L    ALT 28 10 - 49 U/L    Alkaline Phosphatase 141 (H) 46 - 116 U/L  Total Bilirubin 0.40 0.30 - 1.20 mg/dl    Total Protein 8.8 (H) 5.7 - 8.2 gm/dl    Albumin 3.6 3.4 - 5.0 gm/dl   POCT Glucose    Collection Time: 03/25/22  5:10 PM   Result Value Ref Range    POC Glucose 340 (H) 65 - 105 mg/dL   POCT Urinalysis no Micro    Collection Time: 03/25/22  5:47 PM   Result Value Ref Range    Glucose, Ur 500 (A) NEGATIVE,Negative mg/dl    Bilirubin, Urine Negative NEGATIVE,Negative      Ketones, Urine 15 (A) NEGATIVE,Negative mg/dl    Specific Gravity, Urine 1.025 1.005 - 1.030      Blood, Urine Negative NEGATIVE,Negative      pH, Urine 5.5 5 - 9      Protein, Urine 30 (A)  NEGATIVE,Negative mg/dl    Urobilinogen, Urine 0.2 0.0 - 1.0 EU/dl    Nitrite, Urine Negative NEGATIVE,Negative      Leukocyte Esterase, Urine Negative NEGATIVE,Negative      Color, UA Yellow      Clarity, UA Clear     POC CHEM 8    Collection Time: 03/25/22  5:48 PM   Result Value Ref Range    Sodium 136 136 - 145 mEq/L    Potassium 3.9 3.5 - 4.9 mEq/L    Chloride 104 98 - 107 mEq/L    Total CO2 18 (L) 21 - 32 mmol/L    Glucose 369 (H) 74 - 106 mg/dL    BUN 15 7 - 25 mg/dl    Creatinine 0.5 (L) 0.6 - 1.3 mg/dl    Hematocrit 43 38 - 45 %    Hemoglobin 14.6 13.0 - 17.2 gm/dl    Calcium, Ionized 9.24 4.40 - 5.40 mg/dL   POC QA8:TMHDQ GAS,LACTATE    Collection Time: 03/25/22  6:00 PM   Result Value Ref Range    Ph 7.350 7.350 - 7.450      PCO2 29.7 (L) 35.0 - 45.0 mm Hg    PO2 65.0 (L) 75 - 100 mm Hg    HCO3 16.4 (L) 18.0 - 26.0 mmol/L    O2 Sat 92.0 90 - 100 %    Total CO2 17.0 (L) 24 - 29 mmol/L    Lactate 0.91 0.40 - 2.00 mmol/L    Base Excess -9 (L) -2 - 3 mmol/L    Pt Temp 98.2 F      Sample Type Art      Draw Site R Radial      Delivery Systems Room [employer] Test Pass     POCT Glucose    Collection Time: 03/25/22  6:38 PM   Result Value Ref Range    POC Glucose 359 (H) 65 - 105 mg/dL       Signed:  Lunette Stands, DO  03/25/2022  6:58 PM    - Total time spent:  60 minutes, of which more than 50% was spent in coordination of care and counseling (time spent with patient face to face, performing the physical exam, reviewing laboratory and imaging results, speaking with physicians and nursing staff involved in this patient's care).

## 2022-03-25 NOTE — ED Provider Notes (Signed)
[facility]  Emergency Department Treatment Report        Patient: Barbara Doyle Age: 46 y.o. Sex: female    Date of Birth: 1976/02/22 Admit Date: 03/25/2022 PCP: No primary care provider on file.   MRN: [id number]  CSN: [id number]     Room: ER28/ER28 Time Dictated: 5:55 PM            Chief Complaint   Chief Complaint   Patient presents with    Dizziness       History of Present Illness   This is a 46 y.o. female with history of hypertension, diabetes, anxiety, endometriosis who was transferred from [facility] due to subacute CVA.  Patient states that she was doing okay until 2 days ago when she started with progressive dizziness associated to nausea and vomiting.  Yesterday, patient got worse and decided to go to the ED.  At the [facility] head CT showed changes of subacute stroke also abdominal pelvis CT showed a pelvic mass.  They were concerned about metastatic disease and brain mass.  Brain MRI showed acute left PICA territory infarct.  No obvious mass.  Patient was transferred to our ED for further evaluation.    Review of Systems   Review of Systems   Constitutional: Negative.  Negative for fever.   HENT: Negative.     Eyes: Negative.    Respiratory: Negative.     Cardiovascular: Negative.  Negative for chest pain.   Gastrointestinal:  Positive for nausea and vomiting. Negative for abdominal pain.   Genitourinary: Negative.    Musculoskeletal: Negative.    Skin: Negative.    Neurological:  Positive for dizziness. Negative for tremors, seizures, syncope, facial asymmetry, speech difficulty, weakness, light-headedness, numbness and headaches.       Past Medical/Surgical History     Past Medical History:   Diagnosis Date    Anxiety     Diabetes (HCC)     Endometriosis     Hypertension      Past Surgical History:   Procedure Laterality Date    APPENDECTOMY         Social History     Social History     Socioeconomic History    Marital status: Not on file     Spouse name: Not on file     Number of children: Not on file    Years of education: Not on file    Highest education level: Not on file   Occupational History    Not on file   Tobacco Use    Smoking status: Not on file    Smokeless tobacco: Not on file   Substance and Sexual Activity    Alcohol use: Not on file    Drug use: Not on file    Sexual activity: Not on file   Other Topics Concern    Not on file   Social History Narrative    Not on file     Social Determinants of Health     Financial Resource Strain: Not on file   Food Insecurity: Not on file   Transportation Needs: Not on file   Physical Activity: Not on file   Stress: Not on file   Social Connections: Not on file   Intimate Partner Violence: Not on file   Housing Stability: Not on file       Family History   History reviewed. No pertinent family history.    Current Medications  Current Facility-Administered Medications   Medication Dose Route Frequency Provider Last Rate Last Admin    ondansetron (ZOFRAN) injection 4 mg  4 mg IntraVENous Once Loa Socks, MD        aspirin tablet 325 mg  325 mg Oral Daily Loa Socks, MD        insulin regular (MYXREDLIN) 100 units in sodium chloride 0.9 % 100 ml infusion  0-100 Units/hr IntraVENous Continuous Loa Socks, MD        0.9 % sodium chloride bolus  2,000 mL IntraVENous Once Loa Socks, MD        dextrose 50 % IV solution  5-25 g IntraVENous PRN Loa Socks, MD        glucagon (rDNA) injection 1 mg  1 mg IntraMUSCular PRN Loa Socks, MD        atorvastatin (LIPITOR) tablet 40 mg  40 mg Oral Nightly Anne Hutchinson, DO         No current outpatient medications on file.       Allergies     Allergies   Allergen Reactions    Codeine Itching, Nausea Only and Rash     Throat gets tight      Hydrocodone Itching and Rash     Throat gets tight         Physical Exam   Patient Vitals for the past 24 hrs:   Temp Pulse Resp BP SpO2   03/25/22 1711 -- 94 24 -- 93 %    03/25/22 1703 -- 93 28 (!) 176/119 91 %   03/25/22 1657 98.2 F (36.8 C) -- -- -- --   03/25/22 1652 -- 99 17 (!) 177/141 91 %   03/25/22 1648 -- -- -- (!) 188/129 93 %   03/25/22 1645 -- -- -- (!) 176/141 96 %     Physical Exam  Vitals and nursing note reviewed. Exam conducted with a chaperone present.   Constitutional:       General: She is not in acute distress.     Appearance: Normal appearance. She is ill-appearing. She is not toxic-appearing or diaphoretic.   HENT:      Head: Normocephalic and atraumatic.      Nose: Nose normal.      Mouth/Throat:      Mouth: Mucous membranes are dry.   Eyes:      Extraocular Movements: Extraocular movements intact.      Right eye: Nystagmus present.      Left eye: Nystagmus present.      Conjunctiva/sclera: Conjunctivae normal.      Pupils: Pupils are equal, round, and reactive to light.   Cardiovascular:      Rate and Rhythm: Normal rate and regular rhythm.      Pulses: Normal pulses.           Dorsalis pedis pulses are 2+ on the right side and 2+ on the left side.      Heart sounds: Normal heart sounds.   Pulmonary:      Effort: Pulmonary effort is normal.      Breath sounds: Normal breath sounds.   Abdominal:      General: Abdomen is flat. Bowel sounds are normal.      Palpations: Abdomen is soft.   Musculoskeletal:         General: Normal range of motion.      Cervical back: Normal range of motion and neck supple.      Right lower leg: No edema.  Left lower leg: No edema.   Skin:     General: Skin is warm and dry.      Capillary Refill: Capillary refill takes less than 2 seconds.   Neurological:      General: No focal deficit present.      Mental Status: She is alert and oriented to person, place, and time.      GCS: GCS eye subscore is 4. GCS verbal subscore is 5. GCS motor subscore is 6.      Cranial Nerves: Cranial nerves 2-12 are intact.      Sensory: Sensation is intact.      Motor: Motor function is intact. No weakness or pronator drift.      Coordination:  Coordination normal. Finger-Nose-Finger Test and Heel to Shin Test normal.         Impression and Management Plan   46 year old female with history of hypertension, diabetes who was transferred from the [facility] due to CVA and pelvic mass.  Differential diagnoses acute CVA, subacute CVA    Diagnostic Studies   Lab:   Results for orders placed or performed during the hospital encounter of 03/25/22   CBC with Auto Differential   Result Value Ref Range    WBC 10.5 4.0 - 11.0 1000/mm3    RBC 5.30 (H) 3.60 - 5.20 M/uL    Hemoglobin 13.1 11.0 - 16.0 gm/dl    Hematocrit 78.4 69.6 - 47.0 %    MCV 76.8 (L) 80.0 - 98.0 fL    MCH 24.7 (L) 25.4 - 34.6 pg    MCHC 32.2 30.0 - 36.0 gm/dl    Platelets 295 (H) 284 - 450 1000/mm3    MPV 9.3 6.0 - 10.0 fL    RDW 39.2 36.4 - 46.3      Nucleated RBCs 0 0 - 0      Immature Granulocytes 0.2 0.0 - 3.0 %    Neutrophils Segmented 85.8 (H) 34 - 64 %    Lymphocytes 12.4 (L) 28 - 48 %    Monocytes 1.4 1 - 13 %    Eosinophils 0.0 0 - 5 %    Basophils 0.2 0 - 3 %   CMP   Result Value Ref Range    Potassium 3.9 3.5 - 5.1 mEq/L    Chloride 101 98 - 107 mEq/L    Sodium 137 136 - 145 mEq/L    CO2 17 (L) 20 - 31 mEq/L    Glucose 355 (H) 74 - 106 mg/dl    BUN 14 9 - 23 mg/dl    Creatinine 1.32 4.40 - 1.02 mg/dl    GFR African American >60.0      GFR Non-African American >60      Calcium 10.6 (H) 8.7 - 10.4 mg/dl    Anion Gap 19 (H) 5 - 15 mmol/L    AST 17.0 0.0 - 33.9 U/L    ALT 28 10 - 49 U/L    Alkaline Phosphatase 141 (H) 46 - 116 U/L    Total Bilirubin 0.40 0.30 - 1.20 mg/dl    Total Protein 8.8 (H) 5.7 - 8.2 gm/dl    Albumin 3.6 3.4 - 5.0 gm/dl   POCT Glucose   Result Value Ref Range    POC Glucose 340 (H) 65 - 105 mg/dL   POCT Urinalysis no Micro   Result Value Ref Range    Glucose, Ur 500 (A) NEGATIVE,Negative mg/dl    Bilirubin, Urine Negative NEGATIVE,Negative  Ketones, Urine 15 (A) NEGATIVE,Negative mg/dl    Specific Gravity, Urine 1.025 1.005 - 1.030      Blood, Urine  Negative NEGATIVE,Negative      pH, Urine 5.5 5 - 9      Protein, Urine 30 (A) NEGATIVE,Negative mg/dl    Urobilinogen, Urine 0.2 0.0 - 1.0 EU/dl    Nitrite, Urine Negative NEGATIVE,Negative      Leukocyte Esterase, Urine Negative NEGATIVE,Negative      Color, UA Yellow      Clarity, UA Clear     POC CHEM 8   Result Value Ref Range    Sodium 136 136 - 145 mEq/L    Potassium 3.9 3.5 - 4.9 mEq/L    Chloride 104 98 - 107 mEq/L    Total CO2 18 (L) 21 - 32 mmol/L    Glucose 369 (H) 74 - 106 mg/dL    BUN 15 7 - 25 mg/dl    Creatinine 0.5 (L) 0.6 - 1.3 mg/dl    Hematocrit 43 38 - 45 %    Hemoglobin 14.6 13.0 - 17.2 gm/dl    Calcium, Ionized 1.614.70 4.40 - 5.40 mg/dL            Prehospital EKG: Normal sinus rhythm, no diagnostic ST or T-wave changes, no arrhythmia, no ectopy as interpreted by myself          ED Course         EXTERNAL RECORDS REVIEWED:  I reviewed the patient's previous [facility] at [facility] and available outside facilities and note that patient was seen in the ED [facility] where she was diagnosed with acute left cerebral CVA and pelvic mass.  Medical Decision Making  Patient presented with acute dizziness since Tuesday afternoon. Associated inability to ambulate due to dizziness, nausea and vomiting. CT head positive for subacute left cerebral CVA. Patient also complained of abdominal pain on and off for few days. Patient's CT showed pelvic mass suspected metastatic lesion to the brain. Patient was discussed with [facility] at [facility], but there were unable to accept this patient due to lack of beds. [facility] was [facility] management.  PREVIOUS RESULTS REVIEWED: Brain MRI today  IMPRESSION:   Acute left PICA territory infarct. No evidence of hemorrhagic conversion. Mild regional mass effect without herniation.  Abdominal pelvis CT today  IMPRESSION:   Large complex cystic lesion in the abdomen and pelvis measuring approximately 24.7 x 16.7  x 24.6 cm with peripheral regions of enhancing nodularity. Differential includes cystic/mucinous ovarian neoplasm. Consider further evaluation with pelvic MRI with and without contrast and recommend gynecological oncology consultation.          Severe exacerbation or progression of chronic illness: No      CONDITION POSING Threat to body FUNCTION, LIFE, OR LIMB without evaluation and management: Neurological    Comorbidities impacting Evaluation and Management: Hypertension, diabetes      Critical Care Time  Critical care time excluding procedures, but including direct patient care, reviewing medical records, evaluating results of diagnostic testing, discussions with family members, and consulting with physicians:35 minutes      Medications   ondansetron (ZOFRAN) injection 4 mg (has no administration in time range)   aspirin tablet 325 mg (has no administration in time range)   insulin regular (MYXREDLIN) 100 units in sodium chloride 0.9 % 100 ml infusion (has no administration in time range)   0.9 % sodium chloride bolus (has no administration in  time range)   dextrose 50 % IV solution (has no administration in time range)   glucagon (rDNA) injection 1 mg (has no administration in time range)   atorvastatin (LIPITOR) tablet 40 mg (has no administration in time range)           NARRATIVE:    5:25 PM D/w Dr. Kevan Ny (neurology) who will come to see the patient in the ED.  At this moment he is recommending permissive hypertension.    5:37 PM D/w Dr. Sandi Mariscal who wants to wait for neurology evaluation recommendations before admission.    5:54 PM Blood work-up shows hyperglycemia with high anion gap, decreased CO2 and ketones in the urine consistent with DKA.  DKA protocol treatment was ordered.  Dr. Sandi Mariscal is aware and agreed with the plan.    5:51 PM Dr. Kevan Ny evaluated patient and recommended admission to ICU and first dose of aspirin 325 mg.  Dr. Sandi Mariscal agreed with the plan.    5:59 PM Dr. Kevan Ny requested a  dose of labetalol 10 mg IV now due to severe hypertension and only maintenance IV fluids.    Medical Decision Making   I have spoken to Dr. Sandi Mariscal regarding this patient's care and we discussed admission to ICU.      Final Diagnosis     1. 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 Essential hypertension    3. Diabetic ketoacidosis without coma associated with diabetes mellitus due to underlying condition [facility])         Disposition   Admission    Loa Socks, MD  March 25, 2022    My signature above authenticates this document and my orders, the final    diagnosis (es), discharge prescription (s), and instructions in the Epic    record.  If you have any questions please contact [phone].     Nursing notes have been reviewed by the physician/ advanced practice    Clinician.                            Loa Socks, MD  03/26/22 [phone]

## 2022-03-25 NOTE — ED Notes (Signed)
MD at bedside to evaluate patient.        Blenda Peals, RN  03/25/22 1714

## 2022-03-25 NOTE — ED Notes (Signed)
Returned from CT.  Family at the bedside.     Lowella Bandy, RN  03/25/22 2110

## 2022-03-25 NOTE — Other (Addendum)
03/25/22 1634   NIHSS Stroke Scale   Total 0   LOC Questions (1b) 0   LOC Commands (1c) 0   Best Gaze (2) 0   Visual (3) 0   Facial Palsy (4) 0   Motor Arm, Left (5a) 0   Motor Arm, Right (5b) 0   Motor Leg, Left (6a) 0   Motor Leg, Right (6b) 0   Limb Ataxia (7) 0   Sensory (8) 0   Best Language (9) 0   Dysarthria (10) 0   Level of Consciousness (1a) 0   Extinction and Inattention (11) 0     Met pt at ED arrival. Transfer from [place] with concerning CT findings.   LKW: 6/27 @ 1330  CC: dizziness. HA since 6/28. +nausea/ vomiting.   Hypertensive at transferring facility receiving multiple doses of Labetalol.   No focal weakness noted. Alert and oriented with fluent speech. Denies paresthesia or vision changes.     Imaging disc not sent with pt however able to obtain report for imaging from [facility])  CT head w/o IV contrast 03/25/22 @ 836  IMPRESSION:   1.  Patchy area of low attenuation in the posterior left cerebellar hemisphere suggestive of infarct of indeterminant age, suspect subacute. Consider MRI for further evaluation/better characterization.   2.  Mild chronic microangiopathic changes.   Addendum    Addendum by Kenn File, MD on 03/25/2022  9:22 AM EDT   Given the findings of a large complex cystic mass in the abdomen and pelvis on same day CT abdomen and pelvis, metastatic disease is favored. Further evaluation with brain MRI with and without contrast is recommended.    MRI brain w/o IV contrast 03/25/22 @ 1449  Impression    IMPRESSION:   Acute left PICA territory infarct. No evidence of hemorrhagic conversion. Mild regional mass effect without herniation.

## 2022-03-25 NOTE — ED Notes (Signed)
Transported to CT via stretcher by this nurse.  Assisted to the bathroom.  One episode of urination.  Ambulated well under supervision.     Lowella Bandy, RN  03/25/22 2109

## 2022-03-25 NOTE — ED Triage Notes (Signed)
Patient is a transfer from [facility] for a stroke. Patient with dizziness x 2-3 days. + vomiting bile. Stated stage 4 cancer.

## 2022-03-25 NOTE — Consults (Cosign Needed)
[facility]     ICU Consult Note    PATIENT:  Barbara Doyle  SEX:   female  DOB: 1976-06-22  AGE: 46 y.o.  DOA: 03/25/2022  LOS: LOS: 0 days   Code Status: No Order    DATE OF CONSULT:March 25, 2022    REFERRING PHYSICIAN:  DR. Sandi Mariscal    REASON FOR CONSULTATION: ICU management    ASSESSMENT:   - Acute left PICA territory infarct on MRI brain done at [facility]  - DKA, blood sugar in the 400s on presentation, UA with ketones, protein, AG 19  - Nausea, vomiting and dizziness secondary to 1 and 2  - Pelvic mass on CT abd/pelvis done at [facility].  - Hypertension  - Hx DM II, on Metformin  - Hx of Endometriosis  - Hx Anxiety    PLAN:   -Currently on RA. Supplemental O2 to keep SpO2 >90.   -Neuro checks per protocol. STAT CT head for neuro change  -Obtain CTA head/neck, CT head  -Continue Aspirin daily  -Continue Lipitor 40 mg  -NS at 100 ml/hr  -Continue Insulin drip  -May use Cardene to keep SBP 160-180.   -Avoid nephrotoxins. Renally dose medications. Monitor urine output. Daily weights.  -Electrolyte replacements per protocol.   -Glucomander per protocol  -SUP: none  -DVT prophylaxis: SCDs and Heparin SQ  -BMP and CBC in the AM    -Further recommendations will be based on the patient's response to recommended treatment and results of the investigation ordered.      HISTORY OF PRESENT ILLNESS     Barbara Doyle is a 46 y.o. White (non-Hispanic) female with PMHx of hypertension and diabetes who presented to the ED via EMS from [facility] for CVA. The patient stated that she developed nausea, vomiting with dizziness 2 days ago which got worse last night and was bad enough to go to the ER.   MRI brain done at [facility] showed an acute left PICA territory infarct and CT abd/pelvis showed a pelvic mass. TPA was not given since the symptoms began more than 24 hours ago.   Her blood sugar on arrival was in the 400s and she was hypertensive with SBP in the 180s.   She was  transferred to [facility] for [facility] evaluation.   Dr. Kevan Ny from neurology saw the patient and recommended ICU management .  PCCM was consulted for ICU management  Interval History/Events:  Evaluated the patient in the ED. She is awake, alert, and talking with clear speech. She is able to move all of her extremities. Husband is at the bedside.   The patient reported that she suffered from recurrent endometriosis and was never able to get pregnant.     Past Medical History  Past Medical History:   Diagnosis Date    Anxiety     Diabetes (HCC)     Endometriosis     Hypertension        Past Surgical History  Past Surgical History:   Procedure Laterality Date    APPENDECTOMY       Family History  History reviewed. No pertinent family history.    Social History      Medications  Prior to Admission medications    Not on File     Current Facility-Administered Medications   Medication Dose Route Frequency    aspirin tablet 325 mg  325 mg Oral Daily    insulin regular (MYXREDLIN) 100 units in  sodium chloride 0.9 % 100 ml infusion  0-100 Units/hr IntraVENous Continuous    dextrose 50 % IV solution  5-25 g IntraVENous PRN    glucagon (rDNA) injection 1 mg  1 mg IntraMUSCular PRN    atorvastatin (LIPITOR) tablet 40 mg  40 mg Oral Nightly    0.9 % sodium chloride infusion   IntraVENous Continuous    labetalol (NORMODYNE;TRANDATE) injection 10 mg  10 mg IntraVENous NOW     No current outpatient medications on file.       Allergy  Allergies   Allergen Reactions    Codeine Itching, Nausea Only and Rash     Throat gets tight      Hydrocodone Itching and Rash     Throat gets tight           ROS     Review of Systems   Constitutional: Negative.    HENT: Negative.     Eyes: Negative.    Respiratory:  Negative for shortness of breath.    Cardiovascular:  Negative for chest pain.   Gastrointestinal:  Positive for nausea and vomiting.   Endocrine: Negative.    Genitourinary: Negative.    Musculoskeletal: Negative.    Skin: Negative.     Allergic/Immunologic: Negative.    Neurological:  Positive for dizziness.   Hematological: Negative.    Psychiatric/Behavioral: Negative.          Physical Exam      GENERAL: Awake, alert, comfortable  HEENT: Oral mucosa moist. No thrush.   NECK: Supple. Trachea midline.   RESPIRATORY: Bilateral BS present, clear. No rales or rhonchi.   CARDIOVASCULAR: S1 and S2 present. No murmur, rub, or thrill.   ABDOMEN: Soft and nontender with positive bowel sounds. No organomegaly.   MUSCULOSKELETAL: No joint effusions or joint tenderness.   SKIN: No rash. Skin is warm, dry, and intact.   NEUROLOGICAL: Conscious, no focal weakness.      Patient Vitals for the past 24 hrs:   Temp Pulse Resp BP SpO2   03/25/22 1711 -- 94 24 -- 93 %   03/25/22 1703 -- 93 28 (!) 176/119 91 %   03/25/22 1657 98.2 F (36.8 C) -- -- -- --   03/25/22 1652 -- 99 17 (!) 177/141 91 %   03/25/22 1648 -- -- -- (!) 188/129 93 %   03/25/22 1645 -- -- -- (!) 176/141 96 %       Intake/Output:   Last shift:      No intake/output data recorded.  Last 3 shifts: No intake/output data recorded.  No intake or output data in the 24 hours ending 03/25/22 1822       Lab Results   Component Value Date    WBC 10.5 03/25/2022    HGB 14.6 03/25/2022    HCT 43 03/25/2022    MCV 76.8 (L) 03/25/2022    PLT 500 (H) 03/25/2022    LABLYMP 12.4 (L) 03/25/2022    RBC 5.30 (H) 03/25/2022    MCH 24.7 (L) 03/25/2022    MCHC 32.2 03/25/2022    RDW 39.2 03/25/2022          Lab Results   Component Value Date/Time    NA 136 03/25/2022 05:48 PM    K 3.9 03/25/2022 05:48 PM    CL 104 03/25/2022 05:48 PM    CO2 17 03/25/2022 05:07 PM    BUN 15 03/25/2022 05:48 PM    CREATININE 0.5 03/25/2022 05:48 PM  GLUCOSE 369 03/25/2022 05:48 PM    CALCIUM 10.6 03/25/2022 05:07 PM    LABGLOM >60 03/25/2022 05:07 PM       Imaging:    [x] See my orders for details    My assessment, plan of care, findings, medications, side effects etc were discussed with:  [x] nursing [] PT/OT    [] respiratory therapy  [x] Dr.Akkina   [x] family [x] Patient     Thank you for asking to assist in this patient's care. We will follow along with you.    , FNP-BC  Critical Care Nurse Practitioner  Pager: [phone]  March 25, 2022  6:22 PM    Dragon medical dictation software was used for portions of this report. Unintended voice transcription errors may have occurred.

## 2022-03-25 NOTE — H&P (Signed)
Admission History and Physical  Richarda Overlie D.O.     Patient: Barbara Doyle Age: 46 y.o. 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  NIDDM II uncontrolled with hyperglycemia  Hx Endometriosis  Hx Anxiety      Plan:  - appreciate input from neurology, recommends hourly neurochecks in ICU setting  - repeat head CT to monitor for any worsening changes regarding edema, herniation as it relates to the acute ischemic stroke in the PICA distribution territory  -Check stat CTA head and neck to evaluate for large vessel occlusion and origin of the stroke  -Currently on insulin drip and IV fluids per DKA protocol(urinalysis positive ketones, CO2 low, anion gap elevated in setting of hyperglycemia patient only on metformin at home, A1c pending)  -Check lipid panel and start statin therapy with Lipitor 40 mg nightly  -Inpatient consult to stroke coordinator  -2D echocardiogram to further evaluate if cardioembolic etiology of stroke as well as to evaluate for shunting  -Patient given full dose aspirin 325 mg x 1 per neurology recommendations will follow with 81 mg daily tomorrow per neuro, appreciate consultation  -Monitor on telemetry to evaluate for any underlying arrhythmia that could precipitate stroke  -Antiemetics as needed for ongoing nausea, meclizine is relatively ineffective for the patient  -Neuro recommends rather tight window for systolic blood pressure control between 1 60-1 80, given labetalol 10 mg x 1 in the ER secondary toBlood pressures 1 90-200 now improved down to 140 trying to avoid hypotension may need Cardene for  better regulation: Of note patient does take labetalol 100 twice daily as well as hydrochlorothiazide as her chronic blood pressure management    -Patient would ultimately benefit from [facility] evaluation for enlarged mass in abdomen approximately 24 cm question neoplasm versus cystic lesion likely ovarian origin    Diet: N.p.o. for now  DVT ppx: Heparin pending note hemorrhagic conversion on follow-up head CT      DISPO  -Pt to be admitted  at this time for reasons addressed above, continued hospitalization for ongoing assessment and treatment indicated     Anticipated Date of Discharge: TBD  Anticipated Disposition (home, SNF) : TBD    Code Status: Full code    MPOA: Husband Jeanetta Alonzo [phone]      Chief Complaint:   Chief Complaint   Patient presents with    Dizziness         HPI:   Barbara Doyle is a 46 y.o. year old female past medical history of hypertension, non-insulin-dependent diabetes, endometriosis and chronic anxiety who presents today as a transfer from [facility] for concern of acute stroke.    [place], went to ER yesterday and left prior to being seen (per ER notes). Returned to ER today with ongoing nausea, vomiting, dizziness, room spinning sensation.  She is been unable to tolerate any p.o. medications since yesterday.  She does report a dull aching pain on the right side of her abdomen that is been intermittent for at least 1 week  if not longer.  Denies any history of stroke or family history of stroke.  Denies any recent changes to her medications.  She reported that her symptoms actually began on Tuesday approximately 48 hours prior to initial evaluation, she reports dizziness worsens with any change in positioning and began when she was walking towards a restaurant around 1:30 PM Tuesday June 27.  She states opening her eyes and moving her head makes the dizziness much worse as does any relative position change.  She has not been able to take any of  her home medications due to her nausea and vomiting.  She reports feeling very anxious.    She went to the [facility] emergency room earlier today again with her symptoms was noted to have a blood pressure 212/122 and a pulse of 90 on initial evaluation.  Labs this morning showed a white count of 14 hemoglobin 13 platelet count 590, CMP identifies a CO2 of 18 calcium 10.5 glucose of 407 sodium 135 potassium 4.5 creatinine of 0.84 anion gap elevated to 20.  Urinalysis positive for ketones.  She was started on IV fluids given several pushes of IV labetalol Zofran and meclizine with ongoing nausea.  She did have a head CT formal report reads as patchy area of low-attenuation posterior left cerebellar hemisphere suggestive of infarct of indeterminate age, suspect subacute.  She underwent brain MRI which confirms acute left PICA territory infarct, no report of hemorrhagic conversion.  There is mild regional mass effect without herniation in that territory poor MRI report time to 2:49 PM.    Additionally patient had a CT of her abdomen and pelvis with IV contrast this morning identifying a 24 x 16 x 24 cm peripheral enhancing nodularity cystic lesion in the abdomen concern for possible ovarian neoplasm or cystic lesion.    Patient was transferred to our facility for higher level of care.    Upon arrival here patient's blood sugar is still significantly elevated and still with findings consistent with DKA for which she was started on IV fluids and IV insulin.  She required additional IV push of labetalol given systolic blood pressure greater than 190 with emergent neurology consultation by Dr. Kevan Ny.  She had not been given any antiplatelet therapy per med rec review of her [facility] records and 325 aspirin was recommended by neurology.      Review of Systems -   Review of Systems   Constitutional:  Positive for appetite change and fatigue. Negative for chills and fever.   HENT:  Negative for congestion, sinus  pressure and sore throat.    Eyes:  Negative for pain and visual disturbance.   Respiratory:  Negative for cough, shortness of breath and wheezing.    Cardiovascular:  Negative for chest pain, palpitations and leg swelling.   Gastrointestinal:  Positive for nausea and vomiting. Negative for abdominal pain.   Genitourinary:  Negative for dysuria and frequency.   Musculoskeletal:  Negative for back pain.   Skin:  Negative for color change and rash.   Neurological:  Positive for dizziness, light-headedness and headaches. Negative for weakness.   Psychiatric/Behavioral:  Negative for dysphoric mood. The patient is not nervous/anxious.          Past Medical History:  Past Medical History:   Diagnosis Date    Anxiety     Diabetes (HCC)     Endometriosis     Hypertension        Past Surgical History:  Past Surgical  History:   Procedure Laterality Date    APPENDECTOMY         Family History:  History reviewed. No pertinent family history.    Social History:  Social History     Socioeconomic History    Marital status: Married     Spouse name: None    Number of children: None    Years of education: None    Highest education level: None       Home Medications:  Prior to Admission medications    Medication Sig Start Date End Date Taking? Authorizing Provider   metFORMIN (GLUCOPHAGE) 500 MG tablet Take 1 tablet by mouth daily 03/02/22  Yes Historical Provider, MD   sertraline (ZOLOFT) 25 MG tablet Take 1 tablet by mouth daily 03/02/22  Yes Historical Provider, MD   labetalol (NORMODYNE) 100 MG tablet Take 1 tablet by mouth 2 times daily 02/25/22  Yes Historical Provider, MD   hydroCHLOROthiazide (HYDRODIURIL) 25 MG tablet Take 1 tablet by mouth daily 02/25/22  Yes Historical Provider, MD       Allergies:  Allergies   Allergen Reactions    Codeine Itching, Nausea Only and Rash     Throat gets tight      Hydrocodone Itching and Rash     Throat gets tight           Physical Exam:   Visit Vitals  BP (!) 144/96   Pulse 96   Temp 98.2 F  (36.8 C) (Oral)   Resp 23   Ht 5' (1.524 m)   Wt 170 lb (77.1 kg)   SpO2 92%   BMI 33.20 kg/m     Physical Exam  Constitutional:       General: She is awake.      Appearance: Normal appearance.   HENT:      Head: Normocephalic and atraumatic.      Mouth/Throat:      Mouth: Mucous membranes are moist.   Eyes:      General: Lids are normal.      Pupils: Pupils are equal, round, and reactive to light.   Cardiovascular:      Rate and Rhythm: Normal rate and regular rhythm.      Heart sounds: Normal heart sounds, S1 normal and S2 normal.   Pulmonary:      Effort: Pulmonary effort is normal.      Breath sounds: Normal breath sounds.   Abdominal:      General: Abdomen is flat. Bowel sounds are normal.      Palpations: Abdomen is soft.      Tenderness: There is no abdominal tenderness.   Musculoskeletal:         General: Normal range of motion.      Cervical back: Neck supple.      Right lower leg: No edema.      Left lower leg: No edema.   Skin:     General: Skin is warm and dry.      Capillary Refill: Capillary refill takes less than 2 seconds.   Neurological:      General: No focal deficit present.      Mental Status: She is alert and oriented to person, place, and time.   Psychiatric:         Attention and Perception: Attention normal.         Mood and Affect: Mood normal.         Behavior: Behavior is cooperative.  Intake and Output:  Current Shift:  No intake/output data recorded.  Last three shifts:  No intake/output data recorded.    Lab/Data Reviewed:  Recent Results (from the past 24 hour(s))   CBC with Auto Differential    Collection Time: 03/25/22  5:07 PM   Result Value Ref Range    WBC 10.5 4.0 - 11.0 1000/mm3    RBC 5.30 (H) 3.60 - 5.20 M/uL    Hemoglobin 13.1 11.0 - 16.0 gm/dl    Hematocrit 16.1 09.6 - 47.0 %    MCV 76.8 (L) 80.0 - 98.0 fL    MCH 24.7 (L) 25.4 - 34.6 pg    MCHC 32.2 30.0 - 36.0 gm/dl    Platelets 045 (H) 409 - 450 1000/mm3    MPV 9.3 6.0 - 10.0 fL    RDW 39.2 36.4 - 46.3       Nucleated RBCs 0 0 - 0      Immature Granulocytes 0.2 0.0 - 3.0 %    Neutrophils Segmented 85.8 (H) 34 - 64 %    Lymphocytes 12.4 (L) 28 - 48 %    Monocytes 1.4 1 - 13 %    Eosinophils 0.0 0 - 5 %    Basophils 0.2 0 - 3 %   CMP    Collection Time: 03/25/22  5:07 PM   Result Value Ref Range    Potassium 3.9 3.5 - 5.1 mEq/L    Chloride 101 98 - 107 mEq/L    Sodium 137 136 - 145 mEq/L    CO2 17 (L) 20 - 31 mEq/L    Glucose 355 (H) 74 - 106 mg/dl    BUN 14 9 - 23 mg/dl    Creatinine 8.11 9.14 - 1.02 mg/dl    GFR African American >60.0      GFR Non-African American >60      Calcium 10.6 (H) 8.7 - 10.4 mg/dl    Anion Gap 19 (H) 5 - 15 mmol/L    AST 17.0 0.0 - 33.9 U/L    ALT 28 10 - 49 U/L    Alkaline Phosphatase 141 (H) 46 - 116 U/L    Total Bilirubin 0.40 0.30 - 1.20 mg/dl    Total Protein 8.8 (H) 5.7 - 8.2 gm/dl    Albumin 3.6 3.4 - 5.0 gm/dl   Protime-INR    Collection Time: 03/25/22  5:07 PM   Result Value Ref Range    Protime 13.1 (H) 10.2 - 12.9 seconds    INR 1.1 0.1 - 1.1     POCT Glucose    Collection Time: 03/25/22  5:10 PM   Result Value Ref Range    POC Glucose 340 (H) 65 - 105 mg/dL   POCT Urinalysis no Micro    Collection Time: 03/25/22  5:47 PM   Result Value Ref Range    Glucose, Ur 500 (A) NEGATIVE,Negative mg/dl    Bilirubin, Urine Negative NEGATIVE,Negative      Ketones, Urine 15 (A) NEGATIVE,Negative mg/dl    Specific Gravity, Urine 1.025 1.005 - 1.030      Blood, Urine Negative NEGATIVE,Negative      pH, Urine 5.5 5 - 9      Protein, Urine 30 (A) NEGATIVE,Negative mg/dl    Urobilinogen, Urine 0.2 0.0 - 1.0 EU/dl    Nitrite, Urine Negative NEGATIVE,Negative      Leukocyte Esterase, Urine Negative NEGATIVE,Negative      Color, UA Yellow      Clarity, UA Clear  POC CHEM 8    Collection Time: 03/25/22  5:48 PM   Result Value Ref Range    Sodium 136 136 - 145 mEq/L    Potassium 3.9 3.5 - 4.9 mEq/L    Chloride 104 98 - 107 mEq/L    Total CO2 18 (L) 21 - 32 mmol/L    Glucose 369 (H) 74 - 106 mg/dL    BUN  15 7 - 25 mg/dl    Creatinine 0.5 (L) 0.6 - 1.3 mg/dl    Hematocrit 43 38 - 45 %    Hemoglobin 14.6 13.0 - 17.2 gm/dl    Calcium, Ionized 3.61 4.40 - 5.40 mg/dL   POC WE3:XVQMG GAS,LACTATE    Collection Time: 03/25/22  6:00 PM   Result Value Ref Range    Ph 7.350 7.350 - 7.450      PCO2 29.7 (L) 35.0 - 45.0 mm Hg    PO2 65.0 (L) 75 - 100 mm Hg    HCO3 16.4 (L) 18.0 - 26.0 mmol/L    O2 Sat 92.0 90 - 100 %    Total CO2 17.0 (L) 24 - 29 mmol/L    Lactate 0.91 0.40 - 2.00 mmol/L    Base Excess -9 (L) -2 - 3 mmol/L    Pt Temp 98.2 F      Sample Type Art      Draw Site R Radial      Delivery Systems Room [employer] Test Pass     POCT Glucose    Collection Time: 03/25/22  6:38 PM   Result Value Ref Range    POC Glucose 359 (H) 65 - 105 mg/dL   POCT Glucose    Collection Time: 03/25/22  7:50 PM   Result Value Ref Range    POC Glucose 329 (H) 65 - 105 mg/dL   POCT Glucose    Collection Time: 03/25/22  8:53 PM   Result Value Ref Range    POC Glucose 282 (H) 65 - 105 mg/dL   POCT Glucose    Collection Time: 03/25/22 10:00 PM   Result Value Ref Range    POC Glucose 255 (H) 65 - 105 mg/dL     Outside hospital MRI 03/25/2022  Stark Jock, MD - 03/25/2022   Formatting of this note might be different from the original.   EXAMINATION: MRI BRAIN W/O IV CONTRAST     HISTORY: 46 years old Female with  acute CVA       TECHNIQUE: Multisequence, multiplanar MRI of the brain without IV contrast.     COMPARISON: CT head 03/25/2022     FINDINGS:   Restricted diffusion in T2 hyperintensity throughout the inferior left cerebellar hemispheres in keeping with an acute left PICA territory infarct. There is mild swelling resulting in slight distortion of the fourth ventricle but without significant herniation. No hydrocephalus. No evidence of acute intracranial hemorrhage.     Few scattered T2/FLAIR hyperintensities within the white matter of both cerebral hemispheres are nonspecific but most commonly seen in the setting of chronic  ischemic disease.     Intracranial basilar and ICA flow voids are preserved.     The calvarium and skull base are unremarkable. The orbits are unremarkable. Paranasal sinuses and mastoid air cells are clear.     IMPRESSION   IMPRESSION:   Acute left PICA territory infarct. No evidence of hemorrhagic conversion. Mild regional mass effect without herniation.     Critical results were  discussed with Dr. Jeannette How at 03/25/2022 2:49 PM by Dr. Newman Pies.         Outside hospital CTAP   Carolan Clines, MD - 03/25/2022   Formatting of this note might be different from the original.   STUDY: CT abdomen and Pelvis WITH IV contrast     HISTORY: Right lower quadrant pain, nausea and vomiting. History of endometriosis, appendectomy     TECHNIQUE: CT of the abdomen and pelvis WITH intravenous contrast following the administration of 100 cc intravenous contrast. Multiplanar reformats.     COMPARISON: None available.     FINDINGS:   LOWER THORAX: Unremarkable.     HEPATOBILIARY:  Unremarkable.     SPLEEN:  Unremarkable.     PANCREAS:  No focal mass or duct dilatation.     ADRENAL GLANDS:  No nodules.     KIDNEYS AND UPPER URETERS:  Symmetric enhancement. No hydronephrosis or hydroureters. No nephrolithiasis.     GI TRACT:  Limited by lack of enteric contrast. The stomach is unremarkable. The caliber and wall thickness of the small and large bowel loops are within normal limits.     PELVIC STRUCTURES: Urinary bladder is unremarkable. There is a large complex cystic lesion in the abdomen and pelvis measuring approximately 24.7 x 16.7 x 24.6 cm with peripheral regions of enhancing nodularity. This lesion displaces the uterus laterally to the right.     PERITONEUM AND RETROPERITONEUM:  No free or loculated fluid. No free air.     LYMPH NODES:  No suspicious adenopathy.     VESSELS:  The abdominal aorta is nonaneurysmal. The visualized abdominal and pelvic vessels enhance normally.Marland Kitchen     BONES AND SOFT TISSUES:  No acute soft tissue  abnormality. No acute osseous abnormality.     IMPRESSION   IMPRESSION:   Large complex cystic lesion in the abdomen and pelvis measuring approximately 24.7 x 16.7 x 24.6 cm with peripheral regions of enhancing nodularity. Differential includes cystic/mucinous ovarian neoplasm. Consider further evaluation with pelvic MRI with and without contrast and recommend gynecological oncology consultation.                   Reading Doctor: Jefm Petty     - I independently reviewed the imaging studies done on the patient and agree with interpretation done by the radiologist and reviewed available blood work from this presentation  -I discussed the patient with the emergency room physician about the necessity to admit the patient to the hospital  -RECORDS REVIEWED: I reviewed patient's history previous chart and laboratory results during this admission while making the diagnosis and plan for admission to the hospital- this included but not limited to available notes in [facility] Everywhere [facility] system, outpatient PCP records         The total Critical Care time caring for this patient with a life threatening illness is 45 minutes.  This includes direct patient care, reviewing medical records, evaluating results of diagnostic testing, discussions with family members, and consulting with physicians.         Richarda Overlie, DO    March 25, 2022  10:45 PM    Dragon medical dictation software was used for portions of this report.  Unintended voice transcription errors may have occurred.

## 2022-03-26 LAB — BASIC METABOLIC PANEL
Anion Gap: 11 mmol/L (ref 5–15)
BUN: 16 mg/dl (ref 9–23)
CO2: 23 mEq/L (ref 20–31)
Calcium: 10.2 mg/dl (ref 8.7–10.4)
Chloride: 105 mEq/L (ref 98–107)
Creatinine: 0.6 mg/dl (ref 0.55–1.02)
GFR African American: >60
GFR Non-African American: >60
Glucose: 184 mg/dl — ABNORMAL HIGH (ref 74–106)
Potassium: 3.3 mEq/L — ABNORMAL LOW (ref 3.5–5.1)
Sodium: 139 mEq/L (ref 136–145)

## 2022-03-26 LAB — PROTIME-INR
INR: 1.1 (ref 0.1–1.1)
Protime: 13.1 seconds — ABNORMAL HIGH (ref 10.2–12.9)

## 2022-03-26 LAB — EKG 12-LEAD
Atrial Rate: 130 {beats}/min
Calculated P Axis: 67 degrees
Calculated R Axis: -19 degrees
Calculated T Axis: 68 degrees
P-R Interval: 130 ms
Q-T Interval: 318 ms
QRS Duration: 80 ms
QTC Calculation (Bezet): 467 ms
Ventricular Rate: 130 {beats}/min

## 2022-03-26 LAB — POCT GLUCOSE
POC Glucose: 282 mg/dL — ABNORMAL HIGH (ref 65–105)
POC Glucose: 255 mg/dL — ABNORMAL HIGH (ref 65–105)
POC Glucose: 235 mg/dL — ABNORMAL HIGH (ref 65–105)
POC Glucose: 239 mg/dL — ABNORMAL HIGH (ref 65–105)
POC Glucose: 217 mg/dL — ABNORMAL HIGH (ref 65–105)
POC Glucose: 194 mg/dL — ABNORMAL HIGH (ref 65–105)
POC Glucose: 157 mg/dL — ABNORMAL HIGH (ref 65–105)
POC Glucose: 140 mg/dL — ABNORMAL HIGH (ref 65–105)
POC Glucose: 133 mg/dL — ABNORMAL HIGH (ref 65–105)
POC Glucose: 136 mg/dL — ABNORMAL HIGH (ref 65–105)
POC Glucose: 135 mg/dL — ABNORMAL HIGH (ref 65–105)
POC Glucose: 137 mg/dL — ABNORMAL HIGH (ref 65–105)
POC Glucose: 149 mg/dL — ABNORMAL HIGH (ref 65–105)
POC Glucose: 176 mg/dL — ABNORMAL HIGH (ref 65–105)
POC Glucose: 185 mg/dL — ABNORMAL HIGH (ref 65–105)
POC Glucose: 246 mg/dL — ABNORMAL HIGH (ref 65–105)

## 2022-03-26 LAB — HEMOGLOBIN A1C: Hemoglobin A1C: 10.8 % — ABNORMAL HIGH (ref 3.8–5.6)

## 2022-03-26 LAB — CALCIUM, IONIZED: Calcium, Ionized: 5.1 mg/dl (ref 4.4–5.4)

## 2022-03-26 LAB — LIPID PANEL
Chol/HDL Ratio: 5.3 Ratio — ABNORMAL HIGH (ref 0.0–4.4)
Cholesterol: 217 mg/dL — ABNORMAL HIGH (ref 0–199)
HDL: 41 mg/dl (ref 40–60)
LDL Calculated: 152 mg/dl — ABNORMAL HIGH (ref 0–130)
Triglycerides: 118 mg/dl (ref 0–150)

## 2022-03-26 LAB — CBC WITH AUTO DIFFERENTIAL
Anisocytosis: 1
Basophils: 0.2 % (ref 0–3)
Eosinophils: 0.2 % (ref 0–5)
Hematocrit: 39.7 % (ref 35.0–47.0)
Hemoglobin: 12.6 gm/dl (ref 11.0–16.0)
Immature Granulocytes %: 0.4 % (ref 0.0–3.0)
Lymphocytes: 20.4 % — ABNORMAL LOW (ref 28–48)
MCH: 24.6 pg — ABNORMAL LOW (ref 25.4–34.6)
MCHC: 31.7 gm/dl (ref 30.0–36.0)
MCV: 77.4 fL — ABNORMAL LOW (ref 80.0–98.0)
MPV: 9.9 fL (ref 6.0–10.0)
Monocytes: 5.1 % (ref 1–13)
Neutrophils Segmented: 73.7 % — ABNORMAL HIGH (ref 34–64)
Nucleated RBCs: 0 (ref 0–0)
Platelet Appearance: INCREASED
Platelets: 482 10*3/uL — ABNORMAL HIGH (ref 140–450)
RBC: 5.13 M/uL (ref 3.60–5.20)
RDW: 40 (ref 36.4–46.3)
WBC: 12.3 10*3/uL — ABNORMAL HIGH (ref 4.0–11.0)

## 2022-03-26 LAB — MAGNESIUM
Magnesium: 1.8 mg/dL (ref 1.6–2.6)
Magnesium: 1.9 mg/dL (ref 1.6–2.6)
Magnesium: 1.8 mg/dL (ref 1.6–2.6)

## 2022-03-26 LAB — PHOSPHORUS
Phosphorus: 2.5 mg/dL (ref 2.4–5.1)
Phosphorus: 2.2 mg/dL — ABNORMAL LOW (ref 2.4–5.1)

## 2022-03-26 LAB — COMPREHENSIVE METABOLIC PANEL W/ REFLEX TO MG FOR LOW K
ALT: 23 U/L (ref 10–49)
AST: 14 U/L (ref 0.0–33.9)
Albumin: 3.3 gm/dl — ABNORMAL LOW (ref 3.4–5.0)
Alkaline Phosphatase: 121 U/L — ABNORMAL HIGH (ref 46–116)
Anion Gap: 13 mmol/L (ref 5–15)
BUN: 16 mg/dl (ref 9–23)
CO2: 22 mEq/L (ref 20–31)
Calcium: 10.4 mg/dl (ref 8.7–10.4)
Chloride: 105 mEq/L (ref 98–107)
Creatinine: 0.87 mg/dl (ref 0.55–1.02)
Glucose: 212 mg/dl — ABNORMAL HIGH (ref 74–106)
Potassium: 3.8 mEq/L (ref 3.5–5.1)
Sodium: 140 mEq/L (ref 136–145)
Total Bilirubin: 0.3 mg/dl (ref 0.30–1.20)
Total Protein: 7.9 gm/dl (ref 5.7–8.2)

## 2022-03-26 LAB — TROPONIN
Troponin, High Sensitivity: 5 ng/L (ref 0–34)
Troponin, High Sensitivity: 6 ng/L (ref 0–34)

## 2022-03-26 LAB — ECHOCARDIOGRAM COMPLETE 2D W DOPPLER W COLOR: Left Ventricular Ejection Fraction: 66

## 2022-03-26 MED ORDER — POTASSIUM CHLORIDE 10 MEQ/100ML IV SOLN
10 MEQ/0ML | INTRAVENOUS | Status: AC | PRN
Start: 2022-03-26 — End: 2022-03-28

## 2022-03-26 MED ORDER — INSULIN GLARGINE 100 UNIT/ML SC SOLN
100 UNIT/ML | Freq: Once | SUBCUTANEOUS | Status: AC | PRN
Start: 2022-03-26 — End: 2022-03-26
  Administered 2022-03-26: 13:00:00 20 [IU] via SUBCUTANEOUS

## 2022-03-26 MED ORDER — CALCIUM GLUCONATE 10 % IV SOLN
10 % | INTRAVENOUS | Status: AC | PRN
Start: 2022-03-26 — End: 2022-03-28

## 2022-03-26 MED ORDER — INSULIN LISPRO 100 UNIT/ML IJ SOLN
100 UNIT/ML | Freq: Four times a day (QID) | INTRAMUSCULAR | Status: AC
Start: 2022-03-26 — End: 2022-03-29
  Administered 2022-03-26: 23:00:00 4 [IU] via SUBCUTANEOUS
  Administered 2022-03-27: 14:00:00 6 [IU] via SUBCUTANEOUS
  Administered 2022-03-27: 22:00:00 11 [IU] via SUBCUTANEOUS
  Administered 2022-03-27: 02:00:00 2 [IU] via SUBCUTANEOUS
  Administered 2022-03-28: 17:00:00 18 [IU] via SUBCUTANEOUS
  Administered 2022-03-28: 22:00:00 15 [IU] via SUBCUTANEOUS
  Administered 2022-03-28: 14:00:00 12 [IU] via SUBCUTANEOUS
  Administered 2022-03-28: 03:00:00 2 [IU] via SUBCUTANEOUS
  Administered 2022-03-29: 14:00:00 13 [IU] via SUBCUTANEOUS
  Administered 2022-03-29: 17:00:00 16 [IU] via SUBCUTANEOUS
  Administered 2022-03-29: 02:00:00 2 [IU] via SUBCUTANEOUS

## 2022-03-26 MED ORDER — ASPIRIN 81 MG PO CHEW
81 MG | Freq: Every day | ORAL | Status: AC
Start: 2022-03-26 — End: 2022-03-29
  Administered 2022-03-26 – 2022-03-29 (×4): 81 mg via ORAL

## 2022-03-26 MED ORDER — LABETALOL HCL 100 MG PO TABS
100 MG | Freq: Two times a day (BID) | ORAL | Status: AC
Start: 2022-03-26 — End: 2022-03-27
  Administered 2022-03-27: 01:00:00 100 mg via ORAL

## 2022-03-26 MED ORDER — HEPARIN SODIUM (PORCINE) 5000 UNIT/ML IJ SOLN
5000 UNIT/ML | Freq: Three times a day (TID) | INTRAMUSCULAR | Status: AC
Start: 2022-03-26 — End: 2022-03-29
  Administered 2022-03-26 – 2022-03-29 (×10): 5000 [IU] via SUBCUTANEOUS

## 2022-03-26 MED ORDER — INSULIN LISPRO 100 UNIT/ML IJ SOLN
100 UNIT/ML | INTRAMUSCULAR | Status: AC | PRN
Start: 2022-03-26 — End: 2022-03-29

## 2022-03-26 MED ORDER — SODIUM CHLORIDE 0.9 % IV SOLN
0.9 % | INTRAVENOUS | Status: AC | PRN
Start: 2022-03-26 — End: 2022-03-28

## 2022-03-26 MED ORDER — ACETAMINOPHEN 325 MG PO TABS
325 | Freq: Four times a day (QID) | ORAL | Status: DC | PRN
Start: 2022-03-26 — End: 2022-03-29
  Administered 2022-03-26 – 2022-03-29 (×7): 650 mg via ORAL

## 2022-03-26 MED ORDER — METOPROLOL TARTRATE 5 MG/5ML IV SOLN
5 MG/ML | INTRAVENOUS | Status: AC
Start: 2022-03-26 — End: 2022-03-27

## 2022-03-26 MED ORDER — NORMAL SALINE FLUSH 0.9 % IV SOLN
0.9 % | INTRAVENOUS | Status: AC | PRN
Start: 2022-03-26 — End: 2022-03-29

## 2022-03-26 MED ORDER — POTASSIUM CHLORIDE CRYS ER 20 MEQ PO TBCR
20 MEQ | Freq: Once | ORAL | Status: AC
Start: 2022-03-26 — End: 2022-03-26
  Administered 2022-03-26: 21:00:00 40 meq via ORAL

## 2022-03-26 MED ORDER — SODIUM PHOSPHATE 3 MMOL/ML IV SOLN (MIXTURES ONLY)
3 mmol/mL | INTRAVENOUS | Status: AC | PRN
Start: 2022-03-26 — End: 2022-03-28

## 2022-03-26 MED ORDER — INSULIN GLARGINE 100 UNIT/ML SC SOLN
100 UNIT/ML | Freq: Every evening | SUBCUTANEOUS | Status: AC
Start: 2022-03-26 — End: 2022-03-29
  Administered 2022-03-27: 02:00:00 20 [IU] via SUBCUTANEOUS
  Administered 2022-03-28 (×2): 40 [IU] via SUBCUTANEOUS

## 2022-03-26 MED ORDER — POLYETHYLENE GLYCOL 3350 17 G PO PACK
17 g | Freq: Every day | ORAL | Status: AC | PRN
Start: 2022-03-26 — End: 2022-03-29

## 2022-03-26 MED ORDER — DEXTROSE 50 % IV SOLN
50 % | INTRAVENOUS | Status: AC | PRN
Start: 2022-03-26 — End: 2022-03-29

## 2022-03-26 MED ORDER — ONDANSETRON HCL 4 MG/2ML IJ SOLN
4 MG/2ML | Freq: Four times a day (QID) | INTRAMUSCULAR | Status: AC | PRN
Start: 2022-03-26 — End: 2022-03-29
  Administered 2022-03-26 – 2022-03-27 (×4): 4 mg via INTRAVENOUS

## 2022-03-26 MED ORDER — HYDRALAZINE HCL 20 MG/ML IJ SOLN
20 MG/ML | Freq: Four times a day (QID) | INTRAMUSCULAR | Status: AC | PRN
Start: 2022-03-26 — End: 2022-03-29
  Administered 2022-03-26: 17:00:00 20 mg via INTRAVENOUS

## 2022-03-26 MED ORDER — METOPROLOL TARTRATE 5 MG/5ML IV SOLN
5 MG/ML | Freq: Four times a day (QID) | INTRAVENOUS | Status: DC
Start: 2022-03-26 — End: 2022-03-26
  Administered 2022-03-26: 18:00:00 5 mg via INTRAVENOUS

## 2022-03-26 MED ORDER — CALCIUM GLUCONATE-NACL 2-0.675 GM/100ML-% IV SOLN
INTRAVENOUS | Status: AC | PRN
Start: 2022-03-26 — End: 2022-03-28

## 2022-03-26 MED ORDER — NORMAL SALINE FLUSH 0.9 % IV SOLN
0.9 % | Freq: Two times a day (BID) | INTRAVENOUS | Status: AC
Start: 2022-03-26 — End: 2022-03-29
  Administered 2022-03-26 – 2022-03-29 (×8): 10 mL via INTRAVENOUS

## 2022-03-26 MED ORDER — IOPAMIDOL 76 % IV SOLN
76 % | Freq: Once | INTRAVENOUS | Status: AC | PRN
Start: 2022-03-26 — End: 2022-03-25
  Administered 2022-03-26: 01:00:00 80 mL via INTRAVENOUS

## 2022-03-26 MED ORDER — ONDANSETRON 4 MG PO TBDP
4 MG | Freq: Three times a day (TID) | ORAL | Status: AC | PRN
Start: 2022-03-26 — End: 2022-03-29
  Administered 2022-03-28 – 2022-03-29 (×2): 4 mg via ORAL

## 2022-03-26 MED ORDER — MAGNESIUM SULFATE 2000 MG/50 ML IVPB PREMIX
2 GM/50ML | INTRAVENOUS | Status: AC | PRN
Start: 2022-03-26 — End: 2022-03-28

## 2022-03-26 MED ORDER — KCL IN DEXTROSE-NACL 20-5-0.45 MEQ/L-%-% IV SOLN
INTRAVENOUS | Status: AC
Start: 2022-03-26 — End: 2022-03-26
  Administered 2022-03-26: 03:00:00 via INTRAVENOUS

## 2022-03-26 MED ORDER — ACETAMINOPHEN 650 MG RE SUPP
650 | Freq: Four times a day (QID) | RECTAL | Status: DC | PRN
Start: 2022-03-26 — End: 2022-03-29

## 2022-03-26 MED ORDER — POTASSIUM CHLORIDE 20 MEQ/50ML IV SOLN
20 MEQ/50ML | INTRAVENOUS | Status: AC | PRN
Start: 2022-03-26 — End: 2022-03-28

## 2022-03-26 MED ORDER — GLUCAGON HCL RDNA (DIAGNOSTIC) 1 MG IJ SOLR
1 MG | INTRAMUSCULAR | Status: AC | PRN
Start: 2022-03-26 — End: 2022-03-29

## 2022-03-26 MED ORDER — SODIUM CHLORIDE 0.9 % IV SOLN
0.9 % | INTRAVENOUS | Status: AC | PRN
Start: 2022-03-26 — End: 2022-03-29

## 2022-03-26 MED ORDER — SERTRALINE HCL 50 MG PO TABS
50 MG | Freq: Every day | ORAL | Status: AC
Start: 2022-03-26 — End: 2022-03-29
  Administered 2022-03-26 – 2022-03-29 (×4): 25 mg via ORAL

## 2022-03-26 MED ORDER — CALCIUM GLUCONATE-NACL 1-0.675 GM/50ML-% IV SOLN
INTRAVENOUS | Status: AC | PRN
Start: 2022-03-26 — End: 2022-03-28

## 2022-03-26 MED ORDER — SODIUM PHOSPHATE 3 MMOL/ML IV SOLN (MIXTURES ONLY)
3 mmol/mL | INTRAVENOUS | Status: AC | PRN
Start: 2022-03-26 — End: 2022-03-28
  Administered 2022-03-26: 21:00:00 15 mmol via INTRAVENOUS

## 2022-03-26 MED FILL — LANTUS 100 UNIT/ML SC SOLN: 100 UNIT/ML | SUBCUTANEOUS | Qty: 20

## 2022-03-26 MED FILL — HEPARIN SODIUM (PORCINE) 5000 UNIT/ML IJ SOLN: 5000 UNIT/ML | INTRAMUSCULAR | Qty: 1

## 2022-03-26 MED FILL — ONDANSETRON HCL 4 MG/2ML IJ SOLN: 4 MG/2ML | INTRAMUSCULAR | Qty: 2

## 2022-03-26 MED FILL — ACETAMINOPHEN 325 MG PO TABS: 325 MG | ORAL | Qty: 2

## 2022-03-26 MED FILL — ASPIRIN 81 MG PO CHEW: 81 MG | ORAL | Qty: 1

## 2022-03-26 MED FILL — METOPROLOL TARTRATE 5 MG/5ML IV SOLN: 5 MG/ML | INTRAVENOUS | Qty: 5

## 2022-03-26 MED FILL — ISOVUE-370 76 % IV SOLN: 76 % | INTRAVENOUS | Qty: 80

## 2022-03-26 MED FILL — SERTRALINE HCL 50 MG PO TABS: 50 MG | ORAL | Qty: 1

## 2022-03-26 MED FILL — SODIUM PHOSPHATES 45 MMOLE/15ML IV SOLN: 45 MMOLE/15ML | INTRAVENOUS | Qty: 5

## 2022-03-26 MED FILL — ADMELOG 100 UNIT/ML IJ SOLN: 100 UNIT/ML | INTRAMUSCULAR | Qty: 1

## 2022-03-26 MED FILL — ADMELOG 100 UNIT/ML IJ SOLN: 100 UNIT/ML | INTRAMUSCULAR | Qty: 4

## 2022-03-26 MED FILL — HYDRALAZINE HCL 20 MG/ML IJ SOLN: 20 MG/ML | INTRAMUSCULAR | Qty: 1

## 2022-03-26 MED FILL — SODIUM CHLORIDE 0.9 % IV SOLN: 0.9 % | INTRAVENOUS | Qty: 1000

## 2022-03-26 NOTE — Progress Notes (Signed)
Neurology Progress Note    Patient: Barbara Doyle Age: 46 y.o. Sex: female    Date of Birth: 1976/06/26 Admit Date: 03/25/2022 PCP: None None   MRN: [id number]  CSN: [id number]       Date: March 26, 2022     Reason for Neurology Consultation:   L PICA stroke      Assessment:  38F transferred from [facility] with acute vestibular syndrome (room spinning counter clockwise with associated nausea/vomiting), found to have hyperglycemia and an acute left cerebellar stroke, PICA distribution. NIHSS actually 0 with no overt cerebellar signs.  Pt with primarily vertigo and nausea.    Plan:  Asa 325mg  x1, then 81mg /d  Lipitor 40mg /d  CT/CTA head & neck  Q2h neuro checks  Telemetry  Echocardiogram  -ok for SDU      , D.O.  Neurohospitalist  [phone]  ___________________________________________________  24h:  Episodes of vertigo and nausea.  No acute events overnight.    Review of Systems:  10-point review of systems was reviewed with pertinent positives noted in the assessment & plan.    Physical Examination:    Vitals:    03/26/22 1200   BP: (!) 168/96   Pulse: 84   Resp: 19   Temp:    SpO2: 92%       General:  Well nourished, mild distress  HEENT: Normocephalic, atraumatic   Neck: Supple,no cervical lymphadenopathy seen.    Heart: No JVD seen  Skin: No rash seen  Musculoskeletal: No deformity seen  Psych:  Mood and affect appropriate    Neurological Examination:     Mental Status:  Alert and oriented.  Appropriate mood and affect.    Language:  Converses appropriately.   Cranial Nerves:  PERRL, EOMI, no nystagmus, no facial asymmetry, sensation intact  Motor:  No pronator drift.  No abnormal posturing. No hyperkinetic movements observed.  Reflexes:  Symmetric  Sensation:  Intact to light touch and pain  Coordination:  FTN intact. No gross ataxia.  Gait: Deferred          Current Facility-Administered Medications   Medication Dose Route Frequency    calcium gluconate 1,000 mg in sodium chloride 50 mL  1,000 mg  IntraVENous PRN    Or    calcium gluconate 2,000 mg in sodium chloride 100 mL  2,000 mg IntraVENous PRN    Or    calcium gluconate 3,000 mg in sodium chloride 0.9 % 100 mL IVPB  3,000 mg IntraVENous PRN    Or    calcium gluconate 4,000 mg in sodium chloride 0.9 % 100 mL IVPB  4,000 mg IntraVENous PRN    Or    calcium gluconate 5,000 mg in sodium chloride 0.9 % 100 mL IVPB  5,000 mg IntraVENous PRN    Or    calcium gluconate 6,000 mg in sodium chloride 0.9 % 150 mL IVPB  6,000 mg IntraVENous PRN    potassium chloride 20 mEq/50 mL IVPB (Central Line)  20 mEq IntraVENous PRN    Or    potassium chloride 10 mEq/100 mL IVPB (Peripheral Line)  10 mEq IntraVENous PRN    magnesium sulfate 2000 mg in 50 mL IVPB premix  2,000 mg IntraVENous PRN    sodium phosphate 10 mmol in sodium chloride 0.9 % 250 mL IVPB  10 mmol IntraVENous PRN    Or    sodium phosphate 15 mmol in sodium chloride 0.9 % 250 mL IVPB  15 mmol IntraVENous PRN  Or    sodium phosphate 20 mmol in sodium chloride 0.9 % 500 mL IVPB  20 mmol IntraVENous PRN    hydrALAZINE (APRESOLINE) injection 20 mg  20 mg IntraVENous Q6H PRN    dextrose 50 % IV solution  10-50 mL IntraVENous PRN    glucagon (rDNA) injection 1 mg  1 mg IntraMUSCular PRN    insulin glargine (LANTUS) injection vial 1-100 Units  1-100 Units SubCUTAneous QHS    insulin lispro (HUMALOG) injection vial 1-100 Units  1-100 Units SubCUTAneous 4x Daily AC & HS    insulin lispro (HUMALOG) injection vial 1-100 Units  1-100 Units SubCUTAneous PRN    sertraline (ZOLOFT) tablet 25 mg  25 mg Oral Daily    insulin regular (MYXREDLIN) 100 units in sodium chloride 0.9 % 100 ml infusion  0-100 Units/hr IntraVENous Continuous    dextrose 50 % IV solution  5-25 g IntraVENous PRN    glucagon (rDNA) injection 1 mg  1 mg IntraMUSCular PRN    atorvastatin (LIPITOR) tablet 40 mg  40 mg Oral Nightly    sodium chloride flush 0.9 % injection 5-40 mL  5-40 mL IntraVENous 2 times per day    sodium chloride flush 0.9 %  injection 5-40 mL  5-40 mL IntraVENous PRN    0.9 % sodium chloride infusion   IntraVENous PRN    ondansetron (ZOFRAN-ODT) disintegrating tablet 4 mg  4 mg Oral Q8H PRN    Or    ondansetron (ZOFRAN) injection 4 mg  4 mg IntraVENous Q6H PRN    polyethylene glycol (GLYCOLAX) packet 17 g  17 g Oral Daily PRN    acetaminophen (TYLENOL) tablet 650 mg  650 mg Oral Q6H PRN    Or    acetaminophen (TYLENOL) suppository 650 mg  650 mg Rectal Q6H PRN    heparin (porcine) injection 5,000 Units  5,000 Units SubCUTAneous 3 times per day    aspirin chewable tablet 81 mg  81 mg Oral Daily    dextrose 5 % and 0.45 % NaCl with KCl 20 mEq infusion   IntraVENous Continuous     Allergies   Allergen Reactions    Codeine Itching, Nausea Only and Rash     Throat gets tight      Hydrocodone Itching and Rash     Throat gets tight       Past Medical History:   Diagnosis Date    Anxiety     Diabetes (HCC)     Endometriosis     Hypertension      Past Surgical History:   Procedure Laterality Date    APPENDECTOMY       Social History     Socioeconomic History    Marital status: Married     Spouse name: Not on file    Number of children: Not on file    Years of education: Not on file    Highest education level: Not on file   Occupational History    Not on file   Tobacco Use    Smoking status: Not on file    Smokeless tobacco: Not on file   Substance and Sexual Activity    Alcohol use: Not on file    Drug use: Not on file    Sexual activity: Not on file   Other Topics Concern    Not on file   Social History Narrative    Not on file     Social Determinants of Health  Financial Resource Strain: Not on file   Food Insecurity: Not on file   Transportation Needs: Not on file   Physical Activity: Not on file   Stress: Not on file   Social Connections: Not on file   Intimate Partner Violence: Not on file   Housing Stability: Not on file     History reviewed. No pertinent family history.    Lab Review   Recent Results (from the past 24 hour(s))   CBC  with Auto Differential    Collection Time: 03/25/22  5:07 PM   Result Value Ref Range    WBC 10.5 4.0 - 11.0 1000/mm3    RBC 5.30 (H) 3.60 - 5.20 M/uL    Hemoglobin 13.1 11.0 - 16.0 gm/dl    Hematocrit 96.040.7 45.435.0 - 47.0 %    MCV 76.8 (L) 80.0 - 98.0 fL    MCH 24.7 (L) 25.4 - 34.6 pg    MCHC 32.2 30.0 - 36.0 gm/dl    Platelets 098500 (H) 119140 - 450 1000/mm3    MPV 9.3 6.0 - 10.0 fL    RDW 39.2 36.4 - 46.3      Nucleated RBCs 0 0 - 0      Immature Granulocytes 0.2 0.0 - 3.0 %    Neutrophils Segmented 85.8 (H) 34 - 64 %    Lymphocytes 12.4 (L) 28 - 48 %    Monocytes 1.4 1 - 13 %    Eosinophils 0.0 0 - 5 %    Basophils 0.2 0 - 3 %   CMP    Collection Time: 03/25/22  5:07 PM   Result Value Ref Range    Potassium 3.9 3.5 - 5.1 mEq/L    Chloride 101 98 - 107 mEq/L    Sodium 137 136 - 145 mEq/L    CO2 17 (L) 20 - 31 mEq/L    Glucose 355 (H) 74 - 106 mg/dl    BUN 14 9 - 23 mg/dl    Creatinine 1.470.92 8.290.55 - 1.02 mg/dl    GFR African American >60.0      GFR Non-African American >60      Calcium 10.6 (H) 8.7 - 10.4 mg/dl    Anion Gap 19 (H) 5 - 15 mmol/L    AST 17.0 0.0 - 33.9 U/L    ALT 28 10 - 49 U/L    Alkaline Phosphatase 141 (H) 46 - 116 U/L    Total Bilirubin 0.40 0.30 - 1.20 mg/dl    Total Protein 8.8 (H) 5.7 - 8.2 gm/dl    Albumin 3.6 3.4 - 5.0 gm/dl   Protime-INR    Collection Time: 03/25/22  5:07 PM   Result Value Ref Range    Protime 13.1 (H) 10.2 - 12.9 seconds    INR 1.1 0.1 - 1.1     POCT Glucose    Collection Time: 03/25/22  5:10 PM   Result Value Ref Range    POC Glucose 340 (H) 65 - 105 mg/dL   POCT Urinalysis no Micro    Collection Time: 03/25/22  5:47 PM   Result Value Ref Range    Glucose, Ur 500 (A) NEGATIVE,Negative mg/dl    Bilirubin, Urine Negative NEGATIVE,Negative      Ketones, Urine 15 (A) NEGATIVE,Negative mg/dl    Specific Gravity, Urine 1.025 1.005 - 1.030      Blood, Urine Negative NEGATIVE,Negative      pH, Urine 5.5 5 - 9      Protein, Urine 30 (  A) NEGATIVE,Negative mg/dl    Urobilinogen, Urine 0.2 0.0  - 1.0 EU/dl    Nitrite, Urine Negative NEGATIVE,Negative      Leukocyte Esterase, Urine Negative NEGATIVE,Negative      Color, UA Yellow      Clarity, UA Clear     POC CHEM 8    Collection Time: 03/25/22  5:48 PM   Result Value Ref Range    Sodium 136 136 - 145 mEq/L    Potassium 3.9 3.5 - 4.9 mEq/L    Chloride 104 98 - 107 mEq/L    Total CO2 18 (L) 21 - 32 mmol/L    Glucose 369 (H) 74 - 106 mg/dL    BUN 15 7 - 25 mg/dl    Creatinine 0.5 (L) 0.6 - 1.3 mg/dl    Hematocrit 43 38 - 45 %    Hemoglobin 14.6 13.0 - 17.2 gm/dl    Calcium, Ionized 9.52 4.40 - 5.40 mg/dL   POC WU1:LKGMW GAS,LACTATE    Collection Time: 03/25/22  6:00 PM   Result Value Ref Range    Ph 7.350 7.350 - 7.450      PCO2 29.7 (L) 35.0 - 45.0 mm Hg    PO2 65.0 (L) 75 - 100 mm Hg    HCO3 16.4 (L) 18.0 - 26.0 mmol/L    O2 Sat 92.0 90 - 100 %    Total CO2 17.0 (L) 24 - 29 mmol/L    Lactate 0.91 0.40 - 2.00 mmol/L    Base Excess -9 (L) -2 - 3 mmol/L    Pt Temp 98.2 F      Sample Type Art      Draw Site R Radial      Delivery Systems Room [employer] Test Pass     POCT Glucose    Collection Time: 03/25/22  6:38 PM   Result Value Ref Range    POC Glucose 359 (H) 65 - 105 mg/dL   POCT Glucose    Collection Time: 03/25/22  7:50 PM   Result Value Ref Range    POC Glucose 329 (H) 65 - 105 mg/dL   POCT Glucose    Collection Time: 03/25/22  8:53 PM   Result Value Ref Range    POC Glucose 282 (H) 65 - 105 mg/dL   POCT Glucose    Collection Time: 03/25/22 10:00 PM   Result Value Ref Range    POC Glucose 255 (H) 65 - 105 mg/dL   POCT Glucose    Collection Time: 03/25/22 11:16 PM   Result Value Ref Range    POC Glucose 235 (H) 65 - 105 mg/dL   Hemoglobin N0U    Collection Time: 03/25/22 11:20 PM   Result Value Ref Range    Hemoglobin A1C 10.8 (H) 3.8 - 5.6 %   Lipid Panel    Collection Time: 03/25/22 11:20 PM   Result Value Ref Range    Cholesterol 217 (H) 0 - 199 mg/dl    HDL 41 40 - 60 mg/dl    Triglycerides 725 0 - 150 mg/dl    LDL Calculated 366 (H) 0 - 130  mg/dl    Chol/HDL Ratio 5.3 (H) 0.0 - 4.4 Ratio   Magnesium    Collection Time: 03/25/22 11:20 PM   Result Value Ref Range    Magnesium 1.8 1.6 - 2.6 mg/dL   Troponin    Collection Time: 03/25/22 11:20 PM   Result Value Ref Range  Troponin, High Sensitivity 5 0 - 34 ng/L   POCT Glucose    Collection Time: 03/26/22 12:16 AM   Result Value Ref Range    POC Glucose 239 (H) 65 - 105 mg/dL   POCT Glucose    Collection Time: 03/26/22  1:19 AM   Result Value Ref Range    POC Glucose 217 (H) 65 - 105 mg/dL   Comprehensive Metabolic Panel w/ Reflex to MG    Collection Time: 03/26/22  1:45 AM   Result Value Ref Range    Potassium 3.8 3.5 - 5.1 mEq/L    Chloride 105 98 - 107 mEq/L    Sodium 140 136 - 145 mEq/L    CO2 22 20 - 31 mEq/L    Glucose 212 (H) 74 - 106 mg/dl    BUN 16 9 - 23 mg/dl    Creatinine 1.32 4.40 - 1.02 mg/dl    Calcium 10.2 8.7 - 72.5 mg/dl    Anion Gap 13 5 - 15 mmol/L    AST 14.0 0.0 - 33.9 U/L    ALT 23 10 - 49 U/L    Alkaline Phosphatase 121 (H) 46 - 116 U/L    Total Bilirubin 0.30 0.30 - 1.20 mg/dl    Total Protein 7.9 5.7 - 8.2 gm/dl    Albumin 3.3 (L) 3.4 - 5.0 gm/dl   Magnesium    Collection Time: 03/26/22  1:45 AM   Result Value Ref Range    Magnesium 1.9 1.6 - 2.6 mg/dL   Phosphorus    Collection Time: 03/26/22  1:45 AM   Result Value Ref Range    Phosphorus 2.5 2.4 - 5.1 mg/dL   CBC with Auto Differential    Collection Time: 03/26/22  1:50 AM   Result Value Ref Range    WBC 12.3 (H) 4.0 - 11.0 1000/mm3    RBC 5.13 3.60 - 5.20 M/uL    Hemoglobin 12.6 11.0 - 16.0 gm/dl    Hematocrit 36.6 44.0 - 47.0 %    MCV 77.4 (L) 80.0 - 98.0 fL    MCH 24.6 (L) 25.4 - 34.6 pg    MCHC 31.7 30.0 - 36.0 gm/dl    Platelets 347 (H) 425 - 450 1000/mm3    MPV 9.9 6.0 - 10.0 fL    RDW 40.0 36.4 - 46.3      Nucleated RBCs 0 0 - 0      Immature Granulocytes 0.4 0.0 - 3.0 %    Anisocytosis 1      Neutrophils Segmented 73.7 (H) 34 - 64 %    Lymphocytes 20.4 (L) 28 - 48 %    Monocytes 5.1 1 - 13 %    Eosinophils 0.2 0 - 5 %     Basophils 0.2 0 - 3 %    Platelet Appearance INCREASED     Protime-INR    Collection Time: 03/26/22  1:50 AM   Result Value Ref Range    Protime 13.1 (H) 10.2 - 12.9 seconds    INR 1.1 0.1 - 1.1     POCT Glucose    Collection Time: 03/26/22  2:12 AM   Result Value Ref Range    POC Glucose 194 (H) 65 - 105 mg/dL   POCT Glucose    Collection Time: 03/26/22  3:17 AM   Result Value Ref Range    POC Glucose 157 (H) 65 - 105 mg/dL   POCT Glucose    Collection Time: 03/26/22  4:16 AM  Result Value Ref Range    POC Glucose 140 (H) 65 - 105 mg/dL   POCT Glucose    Collection Time: 03/26/22  5:18 AM   Result Value Ref Range    POC Glucose 133 (H) 65 - 105 mg/dL   POCT Glucose    Collection Time: 03/26/22  6:13 AM   Result Value Ref Range    POC Glucose 136 (H) 65 - 105 mg/dL   POCT Glucose    Collection Time: 03/26/22  7:14 AM   Result Value Ref Range    POC Glucose 135 (H) 65 - 105 mg/dL   POCT Glucose    Collection Time: 03/26/22  7:48 AM   Result Value Ref Range    POC Glucose 137 (H) 65 - 105 mg/dL   POCT Glucose    Collection Time: 03/26/22  8:57 AM   Result Value Ref Range    POC Glucose 149 (H) 65 - 105 mg/dL   POCT Glucose    Collection Time: 03/26/22 10:28 AM   Result Value Ref Range    POC Glucose 176 (H) 65 - 105 mg/dL   POCT Glucose    Collection Time: 03/26/22 11:42 AM   Result Value Ref Range    POC Glucose 185 (H) 65 - 105 mg/dL       Signed:  Lunette Stands, DO  03/26/2022  1:24 PM    - Total time spent:  60 minutes, of which more than 50% was spent in coordination of care and counseling (time spent with patient face to face, performing the physical exam, reviewing laboratory and imaging results, speaking with physicians and nursing staff involved in this patient's care).

## 2022-03-26 NOTE — Progress Notes (Signed)
[facility]        Progress Note    Name: Barbara Doyle   DOB: [birth year]   MRN: [id number]   Date: 03/26/2022    [x] I have reviewed the flowsheet and previous day's notes. Events, vitals, medications and notes from last 24 hours reviewed.       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  Out of window for tPA.  CTA head and neck with no large vessel occlusions.  -Mild DKA, blood sugar in the 400s on presentation, UA with ketones, protein, AG 19  - Nausea, vomiting and dizziness secondary to 1 and 2  -Large complex cystic lesion 24 x 16 x 24 cm on CT abdomen pelvis 03/25/2022 at [facility].    - Hypertension.  On labetalol and hydrochlorothiazide at home.  - Hx DM II, on Metformin  - Hx of Endometriosis  - Hx Anxiety    PLAN:   -Currently on RA. Supplemental O2 to keep SpO2 >90.   -Change neurochecks to every 2 hours.  STAT CT head for neuro change  -Continue Aspirin daily per neurology.  No Plavix at this time per neurology.  -Continue Lipitor 40 mg  -Continue Insulin drip per DKA protocol.  Transition insulin drip to Lantus via Glucomander protocol.  -Goal SBP 150-180 per neurology.  Hold home blood pressure medications for now.  Did not require Cardene.  We will use IV hydralazine as needed for systolic blood pressure more than 180.    -Consider OB/GYN consultation to evaluate large complex cystic lesion on CT abdominal pelvis.  Defer to IM.  -Avoid nephrotoxins. Renally dose medications. Monitor urine output. Daily weights.  -Electrolyte replacements per protocol.   -Glucomander per protocol  -SUP: none  -DVT prophylaxis: SCDs and Heparin SQ  -Transfer to stepdown unit.  PCCM will sign off when patient is out of ICU.  CC time 31 minutes.                Subjective:   No significant overnight events.  Blood pressure  within range.  Still little bit vertigo/dizziness with minimal movement.  No vomiting.    ROS:   As above.  Rest negative.        Allergy:  Allergies   Allergen Reactions    Codeine Itching, Nausea Only and Rash     Throat gets tight      Hydrocodone Itching and Rash     Throat gets tight          Vital Signs:    BP 130/80   Pulse 91   Temp 98.5 F (36.9 C) (Tympanic)   Resp 20   Ht 5' (1.524 m)   Wt 170 lb (77.1 kg)   LMP  (LMP Unknown)   SpO2 93%   BMI 33.20 kg/m             Temp (24hrs), Avg:98.6 F (37 C), Min:98.2 F (36.8 C), Max:99 F (37.2 C)     Patient Vitals for the past 8 hrs:   Temp Pulse Resp BP SpO2   03/26/22 0230 -- 91 20 130/80 93 %   03/26/22 0215 -- 88 19 138/83 93 %   03/26/22 0200 -- 91 20 (!) 155/80 92 %   03/26/22 0145 -- 95 21 (!) 142/81 93 %   03/26/22  0130 -- 90 21 (!) 148/76 94 %   03/26/22 0115 -- 95 20 115/82 92 %   03/26/22 0100 -- 85 19 (!) 158/90 94 %   03/26/22 0045 -- 97 21 (!) 145/84 92 %   03/26/22 0030 -- 94 20 (!) 148/82 92 %   03/26/22 0015 -- 85 22 -- 93 %   03/26/22 0000 98.5 F (36.9 C) 86 17 -- 95 %   03/25/22 2345 -- (!) 101 -- (!) 178/103 94 %   03/25/22 2330 -- 89 20 -- 93 %   03/25/22 2315 -- 86 20 -- 94 %   03/25/22 2300 -- 89 24 -- 93 %       Intake/Output:   Last shift:      06/29 1901 - 06/30 0700  In: 355.6 [I.V.:355.6]  Out: -   Last 3 shifts: No intake/output data recorded.    Intake/Output Summary (Last 24 hours) at 03/26/2022 0630  Last data filed at 03/25/2022 2200  Gross per 24 hour   Intake 355.56 ml   Output --   Net 355.56 ml       Ventilator Settings:  Mode Rate Tidal Volume Pressure FiO2 PEEP                       Physical Exam:    GENERAL: AAO x4 .   NECK: Supple. Trachea midline.   RESPIRATORY: Bilateral BS present, decreased at bases. No rales or rhonchi.   CARDIOVASCULAR: S1 and S2 present, regular. No murmur, rub, or thrill.   ABDOMEN: Soft and nontender with positive bowel sounds. No organomegaly.   NEUROLOGICAL: No focal deficits.    EXT: No edema or cyanosis.       DATA:   Current Facility-Administered Medications   Medication Dose Route Frequency    calcium gluconate 1,000 mg in sodium chloride 50 mL  1,000 mg IntraVENous PRN    Or    calcium gluconate 2,000 mg in sodium chloride 100 mL  2,000 mg IntraVENous PRN    Or    calcium gluconate 3,000 mg in sodium chloride 0.9 % 100 mL IVPB  3,000 mg IntraVENous PRN    Or    calcium gluconate 4,000 mg in sodium chloride 0.9 % 100 mL IVPB  4,000 mg IntraVENous PRN    Or    calcium gluconate 5,000 mg in sodium chloride 0.9 % 100 mL IVPB  5,000 mg IntraVENous PRN    Or    calcium gluconate 6,000 mg in sodium chloride 0.9 % 150 mL IVPB  6,000 mg IntraVENous PRN    potassium chloride 20 mEq/50 mL IVPB (Central Line)  20 mEq IntraVENous PRN    Or    potassium chloride 10 mEq/100 mL IVPB (Peripheral Line)  10 mEq IntraVENous PRN    magnesium sulfate 2000 mg in 50 mL IVPB premix  2,000 mg IntraVENous PRN    sodium phosphate 10 mmol in sodium chloride 0.9 % 250 mL IVPB  10 mmol IntraVENous PRN    Or    sodium phosphate 15 mmol in sodium chloride 0.9 % 250 mL IVPB  15 mmol IntraVENous PRN    Or    sodium phosphate 20 mmol in sodium chloride 0.9 % 500 mL IVPB  20 mmol IntraVENous PRN    insulin regular (MYXREDLIN) 100 units in sodium chloride 0.9 % 100 ml infusion  0-100 Units/hr IntraVENous Continuous    dextrose 50 % IV solution  5-25 g  IntraVENous PRN    glucagon (rDNA) injection 1 mg  1 mg IntraMUSCular PRN    atorvastatin (LIPITOR) tablet 40 mg  40 mg Oral Nightly    0.9 % sodium chloride infusion   IntraVENous Continuous    sodium chloride flush 0.9 % injection 5-40 mL  5-40 mL IntraVENous 2 times per day    sodium chloride flush 0.9 % injection 5-40 mL  5-40 mL IntraVENous PRN    0.9 % sodium chloride infusion   IntraVENous PRN    ondansetron (ZOFRAN-ODT) disintegrating tablet 4 mg  4 mg Oral Q8H PRN    Or    ondansetron (ZOFRAN) injection 4 mg  4 mg IntraVENous Q6H PRN    polyethylene glycol  (GLYCOLAX) packet 17 g  17 g Oral Daily PRN    acetaminophen (TYLENOL) tablet 650 mg  650 mg Oral Q6H PRN    Or    acetaminophen (TYLENOL) suppository 650 mg  650 mg Rectal Q6H PRN    heparin (porcine) injection 5,000 Units  5,000 Units SubCUTAneous 3 times per day    aspirin chewable tablet 81 mg  81 mg Oral Daily    dextrose 5 % and 0.45 % NaCl with KCl 20 mEq infusion   IntraVENous Continuous             Labs:  Reviewed.   Recent Results (from the past 24 hour(s))   CBC with Auto Differential    Collection Time: 03/25/22  5:07 PM   Result Value Ref Range    WBC 10.5 4.0 - 11.0 1000/mm3    RBC 5.30 (H) 3.60 - 5.20 M/uL    Hemoglobin 13.1 11.0 - 16.0 gm/dl    Hematocrit 16.140.7 09.635.0 - 47.0 %    MCV 76.8 (L) 80.0 - 98.0 fL    MCH 24.7 (L) 25.4 - 34.6 pg    MCHC 32.2 30.0 - 36.0 gm/dl    Platelets 045500 (H) 409140 - 450 1000/mm3    MPV 9.3 6.0 - 10.0 fL    RDW 39.2 36.4 - 46.3      Nucleated RBCs 0 0 - 0      Immature Granulocytes 0.2 0.0 - 3.0 %    Neutrophils Segmented 85.8 (H) 34 - 64 %    Lymphocytes 12.4 (L) 28 - 48 %    Monocytes 1.4 1 - 13 %    Eosinophils 0.0 0 - 5 %    Basophils 0.2 0 - 3 %   CMP    Collection Time: 03/25/22  5:07 PM   Result Value Ref Range    Potassium 3.9 3.5 - 5.1 mEq/L    Chloride 101 98 - 107 mEq/L    Sodium 137 136 - 145 mEq/L    CO2 17 (L) 20 - 31 mEq/L    Glucose 355 (H) 74 - 106 mg/dl    BUN 14 9 - 23 mg/dl    Creatinine 8.110.92 9.140.55 - 1.02 mg/dl    GFR African American >60.0      GFR Non-African American >60      Calcium 10.6 (H) 8.7 - 10.4 mg/dl    Anion Gap 19 (H) 5 - 15 mmol/L    AST 17.0 0.0 - 33.9 U/L    ALT 28 10 - 49 U/L    Alkaline Phosphatase 141 (H) 46 - 116 U/L    Total Bilirubin 0.40 0.30 - 1.20 mg/dl    Total Protein 8.8 (H) 5.7 - 8.2 gm/dl  Albumin 3.6 3.4 - 5.0 gm/dl   Protime-INR    Collection Time: 03/25/22  5:07 PM   Result Value Ref Range    Protime 13.1 (H) 10.2 - 12.9 seconds    INR 1.1 0.1 - 1.1     POCT Glucose    Collection Time: 03/25/22  5:10 PM   Result Value Ref  Range    POC Glucose 340 (H) 65 - 105 mg/dL   POCT Urinalysis no Micro    Collection Time: 03/25/22  5:47 PM   Result Value Ref Range    Glucose, Ur 500 (A) NEGATIVE,Negative mg/dl    Bilirubin, Urine Negative NEGATIVE,Negative      Ketones, Urine 15 (A) NEGATIVE,Negative mg/dl    Specific Gravity, Urine 1.025 1.005 - 1.030      Blood, Urine Negative NEGATIVE,Negative      pH, Urine 5.5 5 - 9      Protein, Urine 30 (A) NEGATIVE,Negative mg/dl    Urobilinogen, Urine 0.2 0.0 - 1.0 EU/dl    Nitrite, Urine Negative NEGATIVE,Negative      Leukocyte Esterase, Urine Negative NEGATIVE,Negative      Color, UA Yellow      Clarity, UA Clear     POC CHEM 8    Collection Time: 03/25/22  5:48 PM   Result Value Ref Range    Sodium 136 136 - 145 mEq/L    Potassium 3.9 3.5 - 4.9 mEq/L    Chloride 104 98 - 107 mEq/L    Total CO2 18 (L) 21 - 32 mmol/L    Glucose 369 (H) 74 - 106 mg/dL    BUN 15 7 - 25 mg/dl    Creatinine 0.5 (L) 0.6 - 1.3 mg/dl    Hematocrit 43 38 - 45 %    Hemoglobin 14.6 13.0 - 17.2 gm/dl    Calcium, Ionized 2.95 4.40 - 5.40 mg/dL   POC MW4:XLKGM GAS,LACTATE    Collection Time: 03/25/22  6:00 PM   Result Value Ref Range    Ph 7.350 7.350 - 7.450      PCO2 29.7 (L) 35.0 - 45.0 mm Hg    PO2 65.0 (L) 75 - 100 mm Hg    HCO3 16.4 (L) 18.0 - 26.0 mmol/L    O2 Sat 92.0 90 - 100 %    Total CO2 17.0 (L) 24 - 29 mmol/L    Lactate 0.91 0.40 - 2.00 mmol/L    Base Excess -9 (L) -2 - 3 mmol/L    Pt Temp 98.2 F      Sample Type Art      Draw Site R Radial      Delivery Systems Room [employer] Test Pass     POCT Glucose    Collection Time: 03/25/22  6:38 PM   Result Value Ref Range    POC Glucose 359 (H) 65 - 105 mg/dL   POCT Glucose    Collection Time: 03/25/22  7:50 PM   Result Value Ref Range    POC Glucose 329 (H) 65 - 105 mg/dL   POCT Glucose    Collection Time: 03/25/22  8:53 PM   Result Value Ref Range    POC Glucose 282 (H) 65 - 105 mg/dL   POCT Glucose    Collection Time: 03/25/22 10:00 PM   Result Value Ref Range     POC Glucose 255 (H) 65 - 105 mg/dL   POCT Glucose    Collection Time: 03/25/22  11:16 PM   Result Value Ref Range    POC Glucose 235 (H) 65 - 105 mg/dL   Hemoglobin Z6W    Collection Time: 03/25/22 11:20 PM   Result Value Ref Range    Hemoglobin A1C 10.8 (H) 3.8 - 5.6 %   Lipid Panel    Collection Time: 03/25/22 11:20 PM   Result Value Ref Range    Cholesterol 217 (H) 0 - 199 mg/dl    HDL 41 40 - 60 mg/dl    Triglycerides 109 0 - 150 mg/dl    LDL Calculated 323 (H) 0 - 130 mg/dl    Chol/HDL Ratio 5.3 (H) 0.0 - 4.4 Ratio   Magnesium    Collection Time: 03/25/22 11:20 PM   Result Value Ref Range    Magnesium 1.8 1.6 - 2.6 mg/dL   Troponin    Collection Time: 03/25/22 11:20 PM   Result Value Ref Range    Troponin, High Sensitivity 5 0 - 34 ng/L   POCT Glucose    Collection Time: 03/26/22 12:16 AM   Result Value Ref Range    POC Glucose 239 (H) 65 - 105 mg/dL   POCT Glucose    Collection Time: 03/26/22  1:19 AM   Result Value Ref Range    POC Glucose 217 (H) 65 - 105 mg/dL   Comprehensive Metabolic Panel w/ Reflex to MG    Collection Time: 03/26/22  1:45 AM   Result Value Ref Range    Potassium 3.8 3.5 - 5.1 mEq/L    Chloride 105 98 - 107 mEq/L    Sodium 140 136 - 145 mEq/L    CO2 22 20 - 31 mEq/L    Glucose 212 (H) 74 - 106 mg/dl    BUN 16 9 - 23 mg/dl    Creatinine 5.57 3.22 - 1.02 mg/dl    Calcium 02.5 8.7 - 42.7 mg/dl    Anion Gap 13 5 - 15 mmol/L    AST 14.0 0.0 - 33.9 U/L    ALT 23 10 - 49 U/L    Alkaline Phosphatase 121 (H) 46 - 116 U/L    Total Bilirubin 0.30 0.30 - 1.20 mg/dl    Total Protein 7.9 5.7 - 8.2 gm/dl    Albumin 3.3 (L) 3.4 - 5.0 gm/dl   Magnesium    Collection Time: 03/26/22  1:45 AM   Result Value Ref Range    Magnesium 1.9 1.6 - 2.6 mg/dL   Phosphorus    Collection Time: 03/26/22  1:45 AM   Result Value Ref Range    Phosphorus 2.5 2.4 - 5.1 mg/dL   CBC with Auto Differential    Collection Time: 03/26/22  1:50 AM   Result Value Ref Range    WBC 12.3 (H) 4.0 - 11.0 1000/mm3    RBC 5.13 3.60 - 5.20  M/uL    Hemoglobin 12.6 11.0 - 16.0 gm/dl    Hematocrit 06.2 37.6 - 47.0 %    MCV 77.4 (L) 80.0 - 98.0 fL    MCH 24.6 (L) 25.4 - 34.6 pg    MCHC 31.7 30.0 - 36.0 gm/dl    Platelets 283 (H) 151 - 450 1000/mm3    MPV 9.9 6.0 - 10.0 fL    RDW 40.0 36.4 - 46.3      Nucleated RBCs 0 0 - 0      Immature Granulocytes 0.4 0.0 - 3.0 %    Anisocytosis 1      Neutrophils Segmented 73.7 (  H) 34 - 64 %    Lymphocytes 20.4 (L) 28 - 48 %    Monocytes 5.1 1 - 13 %    Eosinophils 0.2 0 - 5 %    Basophils 0.2 0 - 3 %    Platelet Appearance INCREASED     Protime-INR    Collection Time: 03/26/22  1:50 AM   Result Value Ref Range    Protime 13.1 (H) 10.2 - 12.9 seconds    INR 1.1 0.1 - 1.1     POCT Glucose    Collection Time: 03/26/22  2:12 AM   Result Value Ref Range    POC Glucose 194 (H) 65 - 105 mg/dL   POCT Glucose    Collection Time: 03/26/22  3:17 AM   Result Value Ref Range    POC Glucose 157 (H) 65 - 105 mg/dL   POCT Glucose    Collection Time: 03/26/22  4:16 AM   Result Value Ref Range    POC Glucose 140 (H) 65 - 105 mg/dL   POCT Glucose    Collection Time: 03/26/22  5:18 AM   Result Value Ref Range    POC Glucose 133 (H) 65 - 105 mg/dL   POCT Glucose    Collection Time: 03/26/22  6:13 AM   Result Value Ref Range    POC Glucose 136 (H) 65 - 105 mg/dL         Imaging:  Personally reviewed.           Faythe Ghee, MD      Dragon medical dictation software was used for portions of this report. Unintended voice transcription errors may have occurred.

## 2022-03-26 NOTE — Progress Notes (Addendum)
Patient visibly anxious and c/o nausea. Medicated per MAR. BP stable and within parameters post PRN Hydralazine administration, HR 120s. All other VSS. 12-Lead EKG ordered and being performed per protocol.     Dr. Grayland Jack notified, rec'd orders entered into Epic. Lab at bedside to draw labs. IV Metoprolol given and HR back down to 90s (SR), patient stating she feels "much better".

## 2022-03-26 NOTE — Care Coordination-Inpatient (Signed)
D/C Planning Brief Summary:  Independent prior, no DME, lives with spouse in . Anticipate home with family at d/c. Spouse can bring home        Person Interviewed for CMA:  Patient    DME: none  Preferred Pharmacy:  [facility]   Medicaid Status: Will not qualify per patient   SWC placed: no    UAI or PASSR needed:  no         03/26/22 1340   Service Assessment   Patient Orientation Alert and Oriented;Place;Situation;Person;Self   Cognition Alert   History Provided By Patient   Primary Caregiver Self   Support Systems Spouse/Significant Other;Family Members;Friends/Neighbors   Patient's Healthcare Decision Maker is: Legal Next of Kin   Prior Functional Level Independent in ADLs/IADLs   Can patient return to prior living arrangement Yes   Ability to make needs known: Good   Family able to assist with home care needs: Yes   Would you like for me to discuss the discharge plan with any other family members/significant others, and if so, who? Yes  (spouse)   Social/Functional History   Lives With Spouse   Type of Home House   Home Layout Two level   Home Access Stairs to enter without rails   Entrance Stairs - Number of Steps 1   Bathroom Shower/Tub Tub/Shower unit   Armed forces technical officer None   Home Equipment None   ADL Assistance Independent   Barrister's clerk Yes   Mode of Engineer, water   Occupation Full time employment   Discharge Planning   Type of Residence House   Living Arrangements Spouse/Significant Other   Current Services Prior To Admission None   Potential Assistance Purchasing Medications No   Patient expects to be discharged to: House   One/Two Story Residence Two story   History of falls? 0   Services At/After Discharge   Transition of Care Consult (CM Consult) Discharge Planning   Mode of Transport at Discharge Other (see comment)  (Spouse to pick up)   Confirm Follow Up Transport  Self   Condition of Participation: Discharge Planning   The Patient and/or Patient Representative was provided with a Choice of Provider? Patient   The Patient and/Or Patient Representative agree with the Discharge Plan? Yes

## 2022-03-26 NOTE — Progress Notes (Signed)
Bedside and Verbal shift change report given to Morrie Sheldon, Charity fundraiser (oncoming nurse) by Ladona Ridgel, RN (offgoing nurse). Report included the following information SBAR, Kardex, MAR and Recent Results.

## 2022-03-26 NOTE — Progress Notes (Signed)
Echo Completed

## 2022-03-26 NOTE — Progress Notes (Signed)
INTERNAL MEDICINE PROGRESS NOTE    Date of note:      March 26, 2022    Patient:               Barbara Doyle, 46 y.o., female  Admit Date:        03/25/2022  Length of Stay:  1 day(s)    Problem List:   Patient Active Problem List   Diagnosis    Acute stroke due to ischemia [facility])    Ischemic cerebrovascular accident (CVA) (HCC)       Subjective + interval history     Anxious. Mild improvement in vertigo and nausea.  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    Hypertension   Type 2 diabetes mellitus  History of endometriosis  History of anxiety    Plan :     Continue neurochecks every 2 hours.  Stat CT head if any change in neuro exam.    Neurology input appreciated.  Continue aspirin, Lipitor.    CTA head and neck without large vessel occlusion  ECHO noted EF 66%, negative interatrial shunting  Regular diet per SLP eval  Resume labetalol 100 mg twice daily tonight     For diabetes ketoacidosis, she has been on insulin gtt.  Will be transitioned to subcutaneous insulin per [facility]  K 3.3- replace per protocol  Diabetic diet  Was only on metformin prior to admission for " pre diabetes"    Large complex cystic mass noted on CTAP. Patient has known history of endometriosis. She will see her OBGYN at [facility] after discharge.    DVT prophylaxis-subcutaneous heparin  Discussed with patient's husband at bedside.    Objective:     Visit Vitals  BP (!) 147/88   Pulse 95   Temp 97.4 F (36.3 C) (Tympanic)   Resp 17   Ht 5' (1.524 m)   Wt 170 lb (77.1 kg)   SpO2 93%   BMI 33.20 kg/m                Intake/Output Summary (Last 24 hours) at 03/26/2022 1610  Last data filed at 03/26/2022 1200  Gross per 24 hour   Intake 1632.59 ml   Output 1072 ml   Net 560.59 ml       Physical Exam:     GEN - AAOx3,  anxious  HEENT - mucous membranes moist  Neck - supple, no JVD  Cardiac - RRR, S1, S2, no murmurs  Chest/Lungs - clear to auscultation without wheezes or rhonchi  Abdomen - soft, palpable abdominal mass  Extremities - no clubbing/ cyanosis/ edema  Neuro - follows commands, moves all 4 extremities, no cerebellar signs except nausea, vertigo  Skin - no rashes or lesions    Current medications:       Current Facility-Administered Medications:     calcium gluconate 1,000 mg in sodium chloride 50 mL, 1,000 mg, IntraVENous, PRN **OR** calcium gluconate 2,000 mg in sodium chloride 100 mL, 2,000 mg, IntraVENous, PRN **OR** calcium gluconate 3,000 mg in sodium chloride 0.9 % 100 mL IVPB, 3,000 mg, IntraVENous, PRN **OR** calcium gluconate 4,000 mg in sodium chloride 0.9 % 100 mL IVPB, 4,000 mg, IntraVENous, PRN **OR** calcium gluconate 5,000 mg in sodium chloride  0.9 % 100 mL IVPB, 5,000 mg, IntraVENous, PRN **OR** calcium gluconate 6,000 mg in sodium chloride 0.9 % 150 mL IVPB, 6,000 mg, IntraVENous, PRN, Donalynn FurlongMaria Rosario Foronda, APRN - NP    potassium chloride 20 mEq/50 mL IVPB (Central Line), 20 mEq, IntraVENous, PRN **OR** potassium chloride 10 mEq/100 mL IVPB (Peripheral Line), 10 mEq, IntraVENous, PRN, Donalynn FurlongMaria Rosario Foronda, APRN - NP    magnesium sulfate 2000 mg in 50 mL IVPB premix, 2,000 mg, IntraVENous, PRN, Donalynn FurlongMaria Rosario Oval LinseyForonda, APRN - NP    sodium phosphate 10 mmol in sodium chloride 0.9 % 250 mL IVPB, 10 mmol, IntraVENous, PRN **OR** sodium phosphate 15 mmol in sodium chloride 0.9 % 250 mL IVPB, 15 mmol, IntraVENous, PRN **OR** sodium phosphate 20 mmol in sodium chloride 0.9 % 500 mL IVPB, 20 mmol, IntraVENous, PRN, Donalynn FurlongMaria Rosario Oval LinseyForonda, APRN - NP    hydrALAZINE (APRESOLINE) injection 20 mg, 20 mg, IntraVENous, Q6H PRN, Faythe GheeNaveen Akkina, MD, 20 mg at 03/26/22 1327    dextrose 50 % IV solution, 10-50 mL, IntraVENous, PRN, Faythe GheeNaveen Akkina, MD    glucagon (rDNA) injection 1 mg, 1 mg, IntraMUSCular, PRN, Faythe GheeNaveen Akkina,  MD    insulin glargine (LANTUS) injection vial 1-100 Units, 1-100 Units, SubCUTAneous, QHS, Faythe GheeNaveen Akkina, MD    insulin lispro (HUMALOG) injection vial 1-100 Units, 1-100 Units, SubCUTAneous, 4x Daily AC & HS, Faythe GheeNaveen Akkina, MD    insulin lispro (HUMALOG) injection vial 1-100 Units, 1-100 Units, SubCUTAneous, PRN, Faythe GheeNaveen Akkina, MD    sertraline (ZOLOFT) tablet 25 mg, 25 mg, Oral, Daily, Kiersten Coss, MD, 25 mg at 03/26/22 1022    metoprolol (LOPRESSOR) injection 5 mg, 5 mg, IntraVENous, Q6H, Faythe GheeNaveen Akkina, MD, 5 mg at 03/26/22 1409    metoprolol (LOPRESSOR) 5 MG/5ML injection, , , ,     insulin regular (MYXREDLIN) 100 units in sodium chloride 0.9 % 100 ml infusion, 0-100 Units/hr, IntraVENous, Continuous, Anne Hutchinson, DO, Stopped at 03/26/22 1145    dextrose 50 % IV solution, 5-25 g, IntraVENous, PRN, Anne Hutchinson, DO    glucagon (rDNA) injection 1 mg, 1 mg, IntraMUSCular, PRN, Richarda OverlieAnne Hutchinson, DO    atorvastatin (LIPITOR) tablet 40 mg, 40 mg, Oral, Nightly, Anne Hutchinson, DO    sodium chloride flush 0.9 % injection 5-40 mL, 5-40 mL, IntraVENous, 2 times per day, Anne Hutchinson, DO, 10 mL at 03/26/22 0740    sodium chloride flush 0.9 % injection 5-40 mL, 5-40 mL, IntraVENous, PRN, Anne Hutchinson, DO    0.9 % sodium chloride infusion, , IntraVENous, PRN, Anne Hutchinson, DO    ondansetron (ZOFRAN-ODT) disintegrating tablet 4 mg, 4 mg, Oral, Q8H PRN **OR** ondansetron (ZOFRAN) injection 4 mg, 4 mg, IntraVENous, Q6H PRN, Richarda OverlieAnne Hutchinson, DO, 4 mg at 03/26/22 1338    polyethylene glycol (GLYCOLAX) packet 17 g, 17 g, Oral, Daily PRN, Richarda OverlieAnne Hutchinson, DO    acetaminophen (TYLENOL) tablet 650 mg, 650 mg, Oral, Q6H PRN, 650 mg at 03/26/22 1025 **OR** acetaminophen (TYLENOL) suppository 650 mg, 650 mg, Rectal, Q6H PRN, Richarda OverlieAnne Hutchinson, DO    heparin (porcine) injection 5,000 Units, 5,000 Units, SubCUTAneous, 3 times per day, Richarda OverlieAnne Hutchinson, DO, 5,000 Units at 03/26/22 1327    aspirin chewable tablet 81 mg, 81  mg, Oral, Daily, Anne Hutchinson, DO, 81 mg at 03/26/22 1023    dextrose 5 % and 0.45 % NaCl with KCl 20 mEq infusion, , IntraVENous, Continuous, Terese DoorMelissa Mather, APRN - NP, Stopped at 03/26/22 1145    Labs:     Recent Results (from  the past 24 hour(s))   CBC with Auto Differential    Collection Time: 03/25/22  5:07 PM   Result Value Ref Range    WBC 10.5 4.0 - 11.0 1000/mm3    RBC 5.30 (H) 3.60 - 5.20 M/uL    Hemoglobin 13.1 11.0 - 16.0 gm/dl    Hematocrit 99.3 71.6 - 47.0 %    MCV 76.8 (L) 80.0 - 98.0 fL    MCH 24.7 (L) 25.4 - 34.6 pg    MCHC 32.2 30.0 - 36.0 gm/dl    Platelets 967 (H) 893 - 450 1000/mm3    MPV 9.3 6.0 - 10.0 fL    RDW 39.2 36.4 - 46.3      Nucleated RBCs 0 0 - 0      Immature Granulocytes 0.2 0.0 - 3.0 %    Neutrophils Segmented 85.8 (H) 34 - 64 %    Lymphocytes 12.4 (L) 28 - 48 %    Monocytes 1.4 1 - 13 %    Eosinophils 0.0 0 - 5 %    Basophils 0.2 0 - 3 %   CMP    Collection Time: 03/25/22  5:07 PM   Result Value Ref Range    Potassium 3.9 3.5 - 5.1 mEq/L    Chloride 101 98 - 107 mEq/L    Sodium 137 136 - 145 mEq/L    CO2 17 (L) 20 - 31 mEq/L    Glucose 355 (H) 74 - 106 mg/dl    BUN 14 9 - 23 mg/dl    Creatinine 8.10 1.75 - 1.02 mg/dl    GFR African American >60.0      GFR Non-African American >60      Calcium 10.6 (H) 8.7 - 10.4 mg/dl    Anion Gap 19 (H) 5 - 15 mmol/L    AST 17.0 0.0 - 33.9 U/L    ALT 28 10 - 49 U/L    Alkaline Phosphatase 141 (H) 46 - 116 U/L    Total Bilirubin 0.40 0.30 - 1.20 mg/dl    Total Protein 8.8 (H) 5.7 - 8.2 gm/dl    Albumin 3.6 3.4 - 5.0 gm/dl   Protime-INR    Collection Time: 03/25/22  5:07 PM   Result Value Ref Range    Protime 13.1 (H) 10.2 - 12.9 seconds    INR 1.1 0.1 - 1.1     POCT Glucose    Collection Time: 03/25/22  5:10 PM   Result Value Ref Range    POC Glucose 340 (H) 65 - 105 mg/dL   POCT Urinalysis no Micro    Collection Time: 03/25/22  5:47 PM   Result Value Ref Range    Glucose, Ur 500 (A) NEGATIVE,Negative mg/dl    Bilirubin, Urine Negative  NEGATIVE,Negative      Ketones, Urine 15 (A) NEGATIVE,Negative mg/dl    Specific Gravity, Urine 1.025 1.005 - 1.030      Blood, Urine Negative NEGATIVE,Negative      pH, Urine 5.5 5 - 9      Protein, Urine 30 (A) NEGATIVE,Negative mg/dl    Urobilinogen, Urine 0.2 0.0 - 1.0 EU/dl    Nitrite, Urine Negative NEGATIVE,Negative      Leukocyte Esterase, Urine Negative NEGATIVE,Negative      Color, UA Yellow      Clarity, UA Clear     POC CHEM 8    Collection Time: 03/25/22  5:48 PM   Result Value Ref Range    Sodium 136 136 -  145 mEq/L    Potassium 3.9 3.5 - 4.9 mEq/L    Chloride 104 98 - 107 mEq/L    Total CO2 18 (L) 21 - 32 mmol/L    Glucose 369 (H) 74 - 106 mg/dL    BUN 15 7 - 25 mg/dl    Creatinine 0.5 (L) 0.6 - 1.3 mg/dl    Hematocrit 43 38 - 45 %    Hemoglobin 14.6 13.0 - 17.2 gm/dl    Calcium, Ionized 4.13 4.40 - 5.40 mg/dL   POC KG4:WNUUV GAS,LACTATE    Collection Time: 03/25/22  6:00 PM   Result Value Ref Range    Ph 7.350 7.350 - 7.450      PCO2 29.7 (L) 35.0 - 45.0 mm Hg    PO2 65.0 (L) 75 - 100 mm Hg    HCO3 16.4 (L) 18.0 - 26.0 mmol/L    O2 Sat 92.0 90 - 100 %    Total CO2 17.0 (L) 24 - 29 mmol/L    Lactate 0.91 0.40 - 2.00 mmol/L    Base Excess -9 (L) -2 - 3 mmol/L    Pt Temp 98.2 F      Sample Type Art      Draw Site R Radial      Delivery Systems Room [employer] Test Pass     POCT Glucose    Collection Time: 03/25/22  6:38 PM   Result Value Ref Range    POC Glucose 359 (H) 65 - 105 mg/dL   POCT Glucose    Collection Time: 03/25/22  7:50 PM   Result Value Ref Range    POC Glucose 329 (H) 65 - 105 mg/dL   POCT Glucose    Collection Time: 03/25/22  8:53 PM   Result Value Ref Range    POC Glucose 282 (H) 65 - 105 mg/dL   POCT Glucose    Collection Time: 03/25/22 10:00 PM   Result Value Ref Range    POC Glucose 255 (H) 65 - 105 mg/dL   POCT Glucose    Collection Time: 03/25/22 11:16 PM   Result Value Ref Range    POC Glucose 235 (H) 65 - 105 mg/dL   Hemoglobin O5D    Collection Time: 03/25/22 11:20 PM    Result Value Ref Range    Hemoglobin A1C 10.8 (H) 3.8 - 5.6 %   Lipid Panel    Collection Time: 03/25/22 11:20 PM   Result Value Ref Range    Cholesterol 217 (H) 0 - 199 mg/dl    HDL 41 40 - 60 mg/dl    Triglycerides 664 0 - 150 mg/dl    LDL Calculated 403 (H) 0 - 130 mg/dl    Chol/HDL Ratio 5.3 (H) 0.0 - 4.4 Ratio   Magnesium    Collection Time: 03/25/22 11:20 PM   Result Value Ref Range    Magnesium 1.8 1.6 - 2.6 mg/dL   Troponin    Collection Time: 03/25/22 11:20 PM   Result Value Ref Range    Troponin, High Sensitivity 5 0 - 34 ng/L   POCT Glucose    Collection Time: 03/26/22 12:16 AM   Result Value Ref Range    POC Glucose 239 (H) 65 - 105 mg/dL   POCT Glucose    Collection Time: 03/26/22  1:19 AM   Result Value Ref Range    POC Glucose 217 (H) 65 - 105 mg/dL   Comprehensive Metabolic Panel w/ Reflex  to [facility]    Collection Time: 03/26/22  1:45 AM   Result Value Ref Range    Potassium 3.8 3.5 - 5.1 mEq/L    Chloride 105 98 - 107 mEq/L    Sodium 140 136 - 145 mEq/L    CO2 22 20 - 31 mEq/L    Glucose 212 (H) 74 - 106 mg/dl    BUN 16 9 - 23 mg/dl    Creatinine 6.57 8.46 - 1.02 mg/dl    Calcium 96.2 8.7 - 95.2 mg/dl    Anion Gap 13 5 - 15 mmol/L    AST 14.0 0.0 - 33.9 U/L    ALT 23 10 - 49 U/L    Alkaline Phosphatase 121 (H) 46 - 116 U/L    Total Bilirubin 0.30 0.30 - 1.20 mg/dl    Total Protein 7.9 5.7 - 8.2 gm/dl    Albumin 3.3 (L) 3.4 - 5.0 gm/dl   Magnesium    Collection Time: 03/26/22  1:45 AM   Result Value Ref Range    Magnesium 1.9 1.6 - 2.6 mg/dL   Phosphorus    Collection Time: 03/26/22  1:45 AM   Result Value Ref Range    Phosphorus 2.5 2.4 - 5.1 mg/dL   CBC with Auto Differential    Collection Time: 03/26/22  1:50 AM   Result Value Ref Range    WBC 12.3 (H) 4.0 - 11.0 1000/mm3    RBC 5.13 3.60 - 5.20 M/uL    Hemoglobin 12.6 11.0 - 16.0 gm/dl    Hematocrit 84.1 32.4 - 47.0 %    MCV 77.4 (L) 80.0 - 98.0 fL    MCH 24.6 (L) 25.4 - 34.6 pg    MCHC 31.7 30.0 - 36.0 gm/dl    Platelets 401 (H) 027 - 450 1000/mm3     MPV 9.9 6.0 - 10.0 fL    RDW 40.0 36.4 - 46.3      Nucleated RBCs 0 0 - 0      Immature Granulocytes 0.4 0.0 - 3.0 %    Anisocytosis 1      Neutrophils Segmented 73.7 (H) 34 - 64 %    Lymphocytes 20.4 (L) 28 - 48 %    Monocytes 5.1 1 - 13 %    Eosinophils 0.2 0 - 5 %    Basophils 0.2 0 - 3 %    Platelet Appearance INCREASED     Protime-INR    Collection Time: 03/26/22  1:50 AM   Result Value Ref Range    Protime 13.1 (H) 10.2 - 12.9 seconds    INR 1.1 0.1 - 1.1     POCT Glucose    Collection Time: 03/26/22  2:12 AM   Result Value Ref Range    POC Glucose 194 (H) 65 - 105 mg/dL   POCT Glucose    Collection Time: 03/26/22  3:17 AM   Result Value Ref Range    POC Glucose 157 (H) 65 - 105 mg/dL   POCT Glucose    Collection Time: 03/26/22  4:16 AM   Result Value Ref Range    POC Glucose 140 (H) 65 - 105 mg/dL   POCT Glucose    Collection Time: 03/26/22  5:18 AM   Result Value Ref Range    POC Glucose 133 (H) 65 - 105 mg/dL   POCT Glucose    Collection Time: 03/26/22  6:13 AM   Result Value Ref Range    POC Glucose 136 (H) 65 -  105 mg/dL   POCT Glucose    Collection Time: 03/26/22  7:14 AM   Result Value Ref Range    POC Glucose 135 (H) 65 - 105 mg/dL   POCT Glucose    Collection Time: 03/26/22  7:48 AM   Result Value Ref Range    POC Glucose 137 (H) 65 - 105 mg/dL   POCT Glucose    Collection Time: 03/26/22  8:57 AM   Result Value Ref Range    POC Glucose 149 (H) 65 - 105 mg/dL   POCT Glucose    Collection Time: 03/26/22 10:28 AM   Result Value Ref Range    POC Glucose 176 (H) 65 - 105 mg/dL   POCT Glucose    Collection Time: 03/26/22 11:42 AM   Result Value Ref Range    POC Glucose 185 (H) 65 - 105 mg/dL   Basic Metabolic Panel    Collection Time: 03/26/22  2:53 PM   Result Value Ref Range    Potassium 3.3 (L) 3.5 - 5.1 mEq/L    Chloride 105 98 - 107 mEq/L    Sodium 139 136 - 145 mEq/L    CO2 23 20 - 31 mEq/L    Glucose 184 (H) 74 - 106 mg/dl    BUN 16 9 - 23 mg/dl    Creatinine 1.61 0.96 - 1.02 mg/dl    GFR African  American >60.0      GFR Non-African American >60      Calcium 10.2 8.7 - 10.4 mg/dl    Anion Gap 11 5 - 15 mmol/L   Magnesium    Collection Time: 03/26/22  2:53 PM   Result Value Ref Range    Magnesium 1.8 1.6 - 2.6 mg/dL   Phosphorus    Collection Time: 03/26/22  2:53 PM   Result Value Ref Range    Phosphorus 2.2 (L) 2.4 - 5.1 mg/dL   Calcium, Ionized    Collection Time: 03/26/22  2:53 PM   Result Value Ref Range    Calcium, Ionized 5.1 4.4 - 5.4 mg/dl   Troponin    Collection Time: 03/26/22  2:53 PM   Result Value Ref Range    Troponin, High Sensitivity 6 0 - 34 ng/L       RAD Results:  CTA HEAD W WO CONTRAST    Result Date: 03/25/2022  All CT exams at this facility use one or more dose reduction techniques including automatic exposure control, mA/kV adjustment per patient's size, or iterative reconstruction technique. INDICATION: Dizziness for 3 days, abdominal cancer Comparison: Outside noncontrast head CT 03/25/2022 CTA HEAD: TECHNIQUE: 2 mm axial images obtained through the brain with contrast. 3-D volume rendered CTA images reconstructed. FINDINGS: Bilateral vertebral arteries patent, left dominant. Basilar and posterior cerebral arteries patent. Bilateral intracranial ICA patent with smooth contours bilaterally. Noncalcified plaque right cavernous ICA without significant stenosis. Bilateral ACA and MCA unremarkable. No aneurysm.     IMPRESSION: Normal CTA head. CTA NECK: TECHNIQUE: 2 mm axial images obtained from skull base to thoracic inlet with IV contrast. 3-D volume rendered CTA images obtained. Conventional, CT, or MR angiographic measurements of internal carotid artery stenosis are based on a ratio of artery diameters with the ICA stenosis as the numerator and the distal ICA, just beyond the stenosis, as the dominator. FINDINGS: Great vessels  unremarkable. Bilateral CCA, ICA, and ECA widely patent. Lung apices clear. Osteophytes seen at the vertebral body margins throughout cervical spine. IMPRESSION:  No hemodynamically significant stenosis  by NASCET criteria. Electronically signed by: Darlina Rumpf, MD 03/25/2022 9:00 PM EDT     CT HEAD WO CONTRAST    Result Date: 03/26/2022  EXAMINATION: CT HEAD WO CONTRAST CLINICAL INDICATION: Monitor for edema, herniation related to PICA ischemic CVA on outside hospital imaging (MRI and CT) COMPARISON:  03/25/2022;  TECHNIQUE: All CT exams at this facility use one or more dose reduction techniques including automatic exposure control, ma/kV  adjustment per patient's size, or iterative reconstruction technique. Multiple contiguous axial CT images of the brain were obtained from the vertex of the skull to the skull base without intravenous contrast. FINDINGS: Acute infarction involving the left cerebellum and left cerebellar tonsils. Mild crowding of the foramen magnum. No hemorrhagic transformation. No hydrocephalus. No midline shift. No extra axial fluid collections. Calvarium intact.     IMPRESSION: 1. Acute infarction left cerebellum including left cerebellar tonsils. Mild crowding foramen magnum. 2. No acute hemorrhage. No hydrocephalus Electronically signed by: Earline Mayotte, MD 03/26/2022 12:46 AM EDT     CTA NECK W WO CONTRAST    Result Date: 03/25/2022  All CT exams at this facility use one or more dose reduction techniques including automatic exposure control, mA/kV adjustment per patient's size, or iterative reconstruction technique. INDICATION: Dizziness for 3 days, abdominal cancer Comparison: Outside noncontrast head CT 03/25/2022 CTA HEAD: TECHNIQUE: 2 mm axial images obtained through the brain with contrast. 3-D volume rendered CTA images reconstructed. FINDINGS: Bilateral vertebral arteries patent, left dominant. Basilar and posterior cerebral arteries patent. Bilateral intracranial ICA patent with smooth contours bilaterally. Noncalcified plaque right cavernous ICA without significant stenosis. Bilateral ACA and MCA unremarkable. No aneurysm.     IMPRESSION:  Normal CTA head. CTA NECK: TECHNIQUE: 2 mm axial images obtained from skull base to thoracic inlet with IV contrast. 3-D volume rendered CTA images obtained. Conventional, CT, or MR angiographic measurements of internal carotid artery stenosis are based on a ratio of artery diameters with the ICA stenosis as the numerator and the distal ICA, just beyond the stenosis, as the dominator. FINDINGS: Great vessels  unremarkable. Bilateral CCA, ICA, and ECA widely patent. Lung apices clear. Osteophytes seen at the vertebral body margins throughout cervical spine. IMPRESSION: No hemodynamically significant stenosis by NASCET criteria. Electronically signed by: Darlina Rumpf, MD 03/25/2022 9:00 PM EDT         Total clinical care time was 51   minutes of which more than 50% was spent in coordination of care and counseling (time spent with patient/family face to face, physical exam, reviewing laboratory and imaging investigations, speaking with physicians and nursing staff involved in this patient's care).     Dragon medical dictation system was used for part of this note. Unintentional voice recognition errors may occur    Tia Masker,  MD  [facility]  March 26, 2022  Time: 4:10 PM

## 2022-03-26 NOTE — Progress Notes (Signed)
SPEECH LANGUAGE PATHOLOGY EVALUATION     Patient: Barbara Doyle (46 y.o. female), [birth year]  Room: 4302/4302  Primary Diagnosis: Essential hypertension [I10]  Acute ischemic left posterior cerebral artery (PCA) stroke (HCC) [I63.532]  Diabetic ketoacidosis without coma associated with diabetes mellitus due to underlying condition (HCC) [E08.10]  Ischemic cerebrovascular accident (CVA) (HCC) [I63.9]  Acute stroke due to ischemia [facility]) [I63.9]        Date of Admission: 03/25/2022  Length of Stay: 1 day(s)    Date: 03/26/2022  Time In: 0945  Time Out: 1000  Total Minutes: 15 minutes    Isolation:  No active isolations         PPE: universal  Precautions:  universal, aspiration, and fall    ASSESSMENT:   Orientation: Patient awake  Respiratory Status: Room Air    Evaluation: Swallow eval completed per CVA protocol.  Pt currently NPO but tolerating sips of thin and ice chip without incident.  Pt denies dysphagia, dysarthria or language deficits related to new onset neuro symptoms/CVA. Speech is clear and family reports that pt is at baseline with regards to cognitive-communication skills.  Oral motor exam unremarkable, hough pt did endorse TMJ symptoms, which are baseline. Oral motor exam unremarkable. Trials of ice, thin, puree and solid trials given.  Pt presents with oral pharyngeal swallowing skills that are [facility].  All consistencies presented were with fxnl oral containment, prep and transit.  Swallows appeared timely, laryngeal elevation was adequate to palpation and no wet breath sounds nor throat clearing/cough appreciated post swallows across consistencies, including sequential straw drinking of thin liquids.   Will advance diet to IDDSI-7 (Regular Solids) and thin liquids.  No further skilled SLP intervention indicated at this time.  Available PRN. Signing off.      Functional Oral Intake Scale: Level 7: Total oral intake without restrictions    PLAN/ RECOMMENDATIONS:                 Diet Recommendations: IDDSI 7-  regular and IDDSI 0- thin  Compensatory Swallow Strategies: HOB ~60* for all po feeds and >45* at least 30 minutes following, slow rate of eating/ feeding, alternate solids/ liquids   Medication Administration: as tolerated   Aspiration Precautions: may feed self independently  Risk(s) for Aspiration: medical condition     Therapy Recommendations: SIGN OFF  Discharge Recommendations: to be determined  Other Recommended Services: OT, PT     SUBJECTIVE    Patient Stated: "HI."   Pain Level: no pain s/s observed     OBJECTIVE   Position: HOB 45*  Oral Motor Assessment: oromotor skills WFL  Dentition: natural  Voice:  WFL  Consistencies: ice chip(s) and thin via cup and via straw, regular and puree (IDDSI 4)  Oral Phase: WFL  Pharyngeal Phase: WFL    Modified Rankin Score: Defer to PT/OT    GOALS    See Care Plan     EDUCATION/COMMUNICATION   Education Provided: POC, deficits, and diet recommendations  Individual Educated: Patient and Family/Caregiver  Comprehension: Patient communicated comprehension  Barriers to learning/limitations: None  Staff Education: Educated Nurse to POC, deficits, and diet recommendations.      Safety: Following session, pt educated pt to notify staff of any changes with swallowing/ speech      Thank you for this referral,  Ferd Glassing., CCC-SLP  Speech-Language Pathologist  [facility]  Ext. [phone] or secure chat

## 2022-03-27 LAB — CBC WITH AUTO DIFFERENTIAL
Basophils: 0.3 % (ref 0–3)
Eosinophils: 0.2 % (ref 0–5)
Hematocrit: 37.7 % (ref 35.0–47.0)
Hemoglobin: 12 gm/dl (ref 11.0–16.0)
Immature Granulocytes %: 0.4 % (ref 0.0–3.0)
Lymphocytes: 27.1 % — ABNORMAL LOW (ref 28–48)
MCH: 24.7 pg — ABNORMAL LOW (ref 25.4–34.6)
MCHC: 31.8 gm/dl (ref 30.0–36.0)
MCV: 77.6 fL — ABNORMAL LOW (ref 80.0–98.0)
MPV: 9.4 fL (ref 6.0–10.0)
Monocytes: 7 % (ref 1–13)
Neutrophils Segmented: 65 % — ABNORMAL HIGH (ref 34–64)
Nucleated RBCs: 0 (ref 0–0)
Platelets: 459 10*3/uL — ABNORMAL HIGH (ref 140–450)
RBC: 4.86 M/uL (ref 3.60–5.20)
RDW: 40.5 (ref 36.4–46.3)
WBC: 11.4 10*3/uL — ABNORMAL HIGH (ref 4.0–11.0)

## 2022-03-27 LAB — PHOSPHORUS
Phosphorus: 3.1 mg/dL (ref 2.4–5.1)
Phosphorus: 2.8 mg/dL (ref 2.4–5.1)

## 2022-03-27 LAB — BASIC METABOLIC PANEL
Anion Gap: 9 mmol/L (ref 5–15)
BUN: 17 mg/dl (ref 9–23)
CO2: 26 mEq/L (ref 20–31)
Calcium: 9.7 mg/dl (ref 8.7–10.4)
Chloride: 104 mEq/L (ref 98–107)
Creatinine: 0.63 mg/dl (ref 0.55–1.02)
GFR African American: >60
GFR Non-African American: >60
Glucose: 191 mg/dl — ABNORMAL HIGH (ref 74–106)
Potassium: 3.8 mEq/L (ref 3.5–5.1)
Sodium: 138 mEq/L (ref 136–145)

## 2022-03-27 LAB — MAGNESIUM
Magnesium: 1.8 mg/dL (ref 1.6–2.6)
Magnesium: 1.8 mg/dL (ref 1.6–2.6)

## 2022-03-27 LAB — POCT GLUCOSE
POC Glucose: 201 mg/dL — ABNORMAL HIGH (ref 65–105)
POC Glucose: 154 mg/dL — ABNORMAL HIGH (ref 65–105)
POC Glucose: 187 mg/dL — ABNORMAL HIGH (ref 65–105)
POC Glucose: 215 mg/dL — ABNORMAL HIGH (ref 65–105)

## 2022-03-27 MED ORDER — MECLIZINE HCL 12.5 MG PO TABS
12.5 MG | Freq: Three times a day (TID) | ORAL | Status: AC | PRN
Start: 2022-03-27 — End: 2022-03-29
  Administered 2022-03-27: 18:00:00 12.5 mg via ORAL

## 2022-03-27 MED ORDER — DIAZEPAM 2 MG PO TABS
2 MG | Freq: Three times a day (TID) | ORAL | Status: AC | PRN
Start: 2022-03-27 — End: 2022-03-29
  Administered 2022-03-28 – 2022-03-29 (×2): 2 mg via ORAL

## 2022-03-27 MED ORDER — METOPROLOL TARTRATE 25 MG PO TABS
25 MG | Freq: Two times a day (BID) | ORAL | Status: DC
Start: 2022-03-27 — End: 2022-03-27

## 2022-03-27 MED ORDER — ATORVASTATIN CALCIUM 40 MG PO TABS
40 MG | Freq: Every evening | ORAL | Status: AC
Start: 2022-03-27 — End: 2022-03-29
  Administered 2022-03-28 – 2022-03-29 (×2): 80 mg via ORAL

## 2022-03-27 MED ORDER — AMLODIPINE BESYLATE 5 MG PO TABS
5 MG | Freq: Every day | ORAL | Status: DC
Start: 2022-03-27 — End: 2022-03-27

## 2022-03-27 MED ORDER — LABETALOL HCL 100 MG PO TABS
100 MG | Freq: Two times a day (BID) | ORAL | Status: AC
Start: 2022-03-27 — End: 2022-03-29
  Administered 2022-03-28 – 2022-03-29 (×4): 100 mg via ORAL

## 2022-03-27 MED ORDER — HYDROCHLOROTHIAZIDE 25 MG PO TABS
25 MG | Freq: Every day | ORAL | Status: AC
Start: 2022-03-27 — End: 2022-03-29
  Administered 2022-03-28 – 2022-03-29 (×2): 12.5 mg via ORAL

## 2022-03-27 MED ORDER — HYDROCHLOROTHIAZIDE 25 MG PO TABS
25 MG | Freq: Every day | ORAL | Status: DC
Start: 2022-03-27 — End: 2022-03-27
  Administered 2022-03-27: 13:00:00 12.5 mg via ORAL

## 2022-03-27 MED ORDER — HYDROCHLOROTHIAZIDE 25 MG PO TABS
25 MG | Freq: Every day | ORAL | Status: DC
Start: 2022-03-27 — End: 2022-03-27

## 2022-03-27 MED ORDER — HYDROCHLOROTHIAZIDE 25 MG PO TABS
25 MG | Freq: Once | ORAL | Status: AC
Start: 2022-03-27 — End: 2022-03-27
  Administered 2022-03-27: 17:00:00 12.5 mg via ORAL

## 2022-03-27 MED ORDER — AMLODIPINE BESYLATE 5 MG PO TABS
5 MG | Freq: Every day | ORAL | Status: AC
Start: 2022-03-27 — End: 2022-03-29
  Administered 2022-03-27 – 2022-03-29 (×3): 10 mg via ORAL

## 2022-03-27 MED ORDER — METOPROLOL TARTRATE 25 MG PO TABS
25 MG | Freq: Two times a day (BID) | ORAL | Status: DC
Start: 2022-03-27 — End: 2022-03-27
  Administered 2022-03-27: 13:00:00 12.5 mg via ORAL

## 2022-03-27 MED FILL — MECLIZINE HCL 12.5 MG PO TABS: 12.5 MG | ORAL | Qty: 1

## 2022-03-27 MED FILL — AMLODIPINE BESYLATE 5 MG PO TABS: 5 MG | ORAL | Qty: 2

## 2022-03-27 MED FILL — ONDANSETRON HCL 4 MG/2ML IJ SOLN: 4 MG/2ML | INTRAMUSCULAR | Qty: 2

## 2022-03-27 MED FILL — ASPIRIN 81 MG PO CHEW: 81 MG | ORAL | Qty: 1

## 2022-03-27 MED FILL — HEPARIN SODIUM (PORCINE) 5000 UNIT/ML IJ SOLN: 5000 UNIT/ML | INTRAMUSCULAR | Qty: 1

## 2022-03-27 MED FILL — HYDROCHLOROTHIAZIDE 25 MG PO TABS: 25 MG | ORAL | Qty: 1

## 2022-03-27 MED FILL — ACETAMINOPHEN 325 MG PO TABS: 325 MG | ORAL | Qty: 2

## 2022-03-27 MED FILL — SODIUM CHLORIDE FLUSH 0.9 % IV SOLN: 0.9 % | INTRAVENOUS | Qty: 10

## 2022-03-27 MED FILL — ATORVASTATIN CALCIUM 40 MG PO TABS: 40 MG | ORAL | Qty: 1

## 2022-03-27 MED FILL — ADMELOG 100 UNIT/ML IJ SOLN: 100 UNIT/ML | INTRAMUSCULAR | Qty: 1

## 2022-03-27 MED FILL — LABETALOL HCL 100 MG PO TABS: 100 MG | ORAL | Qty: 1

## 2022-03-27 MED FILL — SERTRALINE HCL 50 MG PO TABS: 50 MG | ORAL | Qty: 1

## 2022-03-27 MED FILL — METOPROLOL TARTRATE 25 MG PO TABS: 25 MG | ORAL | Qty: 1

## 2022-03-27 NOTE — Progress Notes (Signed)
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 Trial of Valium  Diabetic ketoacidosis  A1c 10.8, resolved.   Large complex cystic lesion 24 x 16 x 24 cm on CT abdomen pelvis 03/25/2022 at [facility].  D/W pt- will need to see her GYN when she returns to [facility]  Hypertension   Type 2 diabetes mellitus. Had been on Metformin. Will need addition of insulin for now  History of endometriosis  History of anxiety  DVT proph: sc Heparin  Subjective:   Still C/O vertigo and intermittent nausea.  Mild headache.      Respiratory ROS: no cough, shortness of breath,pleutic chest pain or wheezing  Cardiovascular ROS: no chest pain , dyspnea on exertion, orthopnea  Gastrointestinal ROS: no abdominal pain, nausea,vomiting, diarrhea, melena or hematechezia  Genito-Urinary ROS: no dysuria, trouble voiding, or hematuria  Objective:   Physical Exam:     BP (!) 157/97   Pulse 84   Temp 97.5 F (36.4 C) (Tympanic)   Resp 19   Ht 5' (1.524 m)   Wt 170 lb (77.1 kg)   LMP  (LMP Unknown)   SpO2 94%   BMI 33.20 kg/m         Temp (24hrs), Avg:97.5 F (36.4 C), Min:97 F (36.1 C), Max:98.2 F (36.8 C)    07/01 0701 - 07/01 1900  In: -   Out: 600 [Urine:600]   06/29 1901 - 07/01 0700  In: 2002.6 [P.O.:120; I.V.:1632.6]  Out: 1922 [Urine:1922]    General:  Alert, cooperative, no distress, appears stated age.   Lungs:   Clear to auscultation bilaterally.    :     Heart:  Regular rate and rhythm, S1, S2 normal, no murmur, click, rub or gallop.   Abdomen:   Soft, non-tender. Bowel sounds normal. No masses,  No organomegaly.   Extremities:  FROM, no LE edema   Pulses: 2+ and symmetric all extremities.   Skin: Skin color, texture, turgor normal. No rashes or lesions   :      Data Review:       24 Hour Results:  Recent Results  (from the past 24 hour(s))   POCT Glucose    Collection Time: 03/26/22 10:05 PM   Result Value Ref Range    POC Glucose 201 (H) 65 - 105 mg/dL   CBC with Auto Differential    Collection Time: 03/27/22 12:53 AM   Result Value Ref Range    WBC 11.4 (H) 4.0 - 11.0 1000/mm3    RBC 4.86 3.60 - 5.20 M/uL    Hemoglobin 12.0 11.0 - 16.0 gm/dl    Hematocrit 63.8 75.6 - 47.0 %    MCV 77.6 (L) 80.0 - 98.0 fL    MCH 24.7 (L) 25.4 - 34.6 pg    MCHC 31.8 30.0 - 36.0 gm/dl    Platelets 433 (H) 295 - 450 1000/mm3    MPV 9.4 6.0 - 10.0 fL    RDW 40.5 36.4 - 46.3      Nucleated RBCs 0 0 - 0      Immature Granulocytes 0.4 0.0 - 3.0 %    Neutrophils Segmented 65.0 (H) 34 - 64 %    Lymphocytes 27.1 (L) 28 - 48 %    Monocytes 7.0  1 - 13 %    Eosinophils 0.2 0 - 5 %    Basophils 0.3 0 - 3 %   Basic Metabolic Panel    Collection Time: 03/27/22 12:53 AM   Result Value Ref Range    Potassium 3.8 3.5 - 5.1 mEq/L    Chloride 104 98 - 107 mEq/L    Sodium 138 136 - 145 mEq/L    CO2 26 20 - 31 mEq/L    Glucose 191 (H) 74 - 106 mg/dl    BUN 17 9 - 23 mg/dl    Creatinine 4.00 8.67 - 1.02 mg/dl    GFR African American >60.0      GFR Non-African American >60      Calcium 9.7 8.7 - 10.4 mg/dl    Anion Gap 9 5 - 15 mmol/L   Magnesium    Collection Time: 03/27/22 12:53 AM   Result Value Ref Range    Magnesium 1.8 1.6 - 2.6 mg/dL   Phosphorus    Collection Time: 03/27/22 12:53 AM   Result Value Ref Range    Phosphorus 3.1 2.4 - 5.1 mg/dL   Magnesium    Collection Time: 03/27/22  7:50 AM   Result Value Ref Range    Magnesium 1.8 1.6 - 2.6 mg/dL   Phosphorus    Collection Time: 03/27/22  7:50 AM   Result Value Ref Range    Phosphorus 2.8 2.4 - 5.1 mg/dL   POCT Glucose    Collection Time: 03/27/22  7:59 AM   Result Value Ref Range    POC Glucose 154 (H) 65 - 105 mg/dL   POCT Glucose    Collection Time: 03/27/22 12:16 PM   Result Value Ref Range    POC Glucose 187 (H) 65 - 105 mg/dL       Problem List:       Medications reviewed  Current  Facility-Administered Medications   Medication Dose Route Frequency    metoprolol tartrate (LOPRESSOR) tablet 12.5 mg  12.5 mg Oral BID    amLODIPine (NORVASC) tablet 10 mg  10 mg Oral Daily    atorvastatin (LIPITOR) tablet 80 mg  80 mg Oral Nightly    [START ON 03/28/2022] hydroCHLOROthiazide (HYDRODIURIL) tablet 25 mg  25 mg Oral Daily    meclizine (ANTIVERT) tablet 12.5 mg  12.5 mg Oral TID PRN    calcium gluconate 1,000 mg in sodium chloride 50 mL  1,000 mg IntraVENous PRN    Or    calcium gluconate 2,000 mg in sodium chloride 100 mL  2,000 mg IntraVENous PRN    Or    calcium gluconate 3,000 mg in sodium chloride 0.9 % 100 mL IVPB  3,000 mg IntraVENous PRN    Or    calcium gluconate 4,000 mg in sodium chloride 0.9 % 100 mL IVPB  4,000 mg IntraVENous PRN    Or    calcium gluconate 5,000 mg in sodium chloride 0.9 % 100 mL IVPB  5,000 mg IntraVENous PRN    Or    calcium gluconate 6,000 mg in sodium chloride 0.9 % 150 mL IVPB  6,000 mg IntraVENous PRN    potassium chloride 20 mEq/50 mL IVPB (Central Line)  20 mEq IntraVENous PRN    Or    potassium chloride 10 mEq/100 mL IVPB (Peripheral Line)  10 mEq IntraVENous PRN    magnesium sulfate 2000 mg in 50 mL IVPB premix  2,000 mg IntraVENous PRN    sodium phosphate 10 mmol in sodium  chloride 0.9 % 250 mL IVPB  10 mmol IntraVENous PRN    Or    sodium phosphate 15 mmol in sodium chloride 0.9 % 250 mL IVPB  15 mmol IntraVENous PRN    Or    sodium phosphate 20 mmol in sodium chloride 0.9 % 500 mL IVPB  20 mmol IntraVENous PRN    hydrALAZINE (APRESOLINE) injection 20 mg  20 mg IntraVENous Q6H PRN    dextrose 50 % IV solution  10-50 mL IntraVENous PRN    glucagon (rDNA) injection 1 mg  1 mg IntraMUSCular PRN    insulin glargine (LANTUS) injection vial 1-100 Units  1-100 Units SubCUTAneous QHS    insulin lispro (HUMALOG) injection vial 1-100 Units  1-100 Units SubCUTAneous 4x Daily AC & HS    insulin lispro (HUMALOG) injection vial 1-100 Units  1-100 Units SubCUTAneous PRN     sertraline (ZOLOFT) tablet 25 mg  25 mg Oral Daily    dextrose 50 % IV solution  5-25 g IntraVENous PRN    glucagon (rDNA) injection 1 mg  1 mg IntraMUSCular PRN    sodium chloride flush 0.9 % injection 5-40 mL  5-40 mL IntraVENous 2 times per day    sodium chloride flush 0.9 % injection 5-40 mL  5-40 mL IntraVENous PRN    0.9 % sodium chloride infusion   IntraVENous PRN    ondansetron (ZOFRAN-ODT) disintegrating tablet 4 mg  4 mg Oral Q8H PRN    Or    ondansetron (ZOFRAN) injection 4 mg  4 mg IntraVENous Q6H PRN    polyethylene glycol (GLYCOLAX) packet 17 g  17 g Oral Daily PRN    acetaminophen (TYLENOL) tablet 650 mg  650 mg Oral Q6H PRN    Or    acetaminophen (TYLENOL) suppository 650 mg  650 mg Rectal Q6H PRN    heparin (porcine) injection 5,000 Units  5,000 Units SubCUTAneous 3 times per day    aspirin chewable tablet 81 mg  81 mg Oral Daily        Care Plan discussed with: pt & RN    Total time spent with patient: 55 minutes.    Dorothe Pea, MD  March 27, 2022  5:23 PM

## 2022-03-27 NOTE — Progress Notes (Signed)
NUTRITION RECOMMENDATIONS:   Continue 4CCHO diet.   Order Ensure Max Protein daily (provides 150 kcal, 30 g protein each)- chocolate.  Weekly wts to trend.   Discharge recommendations: Consistent Carb    Nutrition Education: 03/27/2022  Educated on Consistent Carb diet, Plate Method, Nutrition Label Reading  Learners: Patient and Significant Other  Readiness: Eager  Method: Explanation, Handout, and Teachback  Response: Verbalizes Understanding and Demonstrated Understanding    NUTRITION INITIAL EVALUATION    NUTRITION ASSESSMENT:       Reason for Assessment: Consult: Diet Education    Principal Diagnosis:  Acute stroke due to ischemia [facility])     PMH:   Past Medical History:   Diagnosis Date    Anxiety     Diabetes (HCC)     Endometriosis     Hypertension      Pertinent Medications: lipitor (FDI), hydrochlorothiazide, glucomander    Pertinent Labs: 6/29- hemoglobin A1c 10.8%    Anthropometrics:  Height:   Ht Readings from Last 3 Encounters:   03/25/22 5' (1.524 m)      Weight:   Wt Readings from Last 10 Encounters:   03/25/22 170 lb (77.1 kg)     BMI: Body mass index is 33.2 kg/m.          IBW: Ideal body weight: 45.5 kg (100 lb 4.9 oz)  UBW: ~170    Wt Change: Pt denies unintended wt changes PTA.     GI Symptoms/Issues:  Pt denies N/V, abd pain, diarrhea. +Constipation.  Abdomen Inspection: Soft  Last BM (including prior to admit):  (PTA)  GI Symptoms: Hiccuping, Reduction in appetite  Tenderness: Soft  Chewing/Swallowing Issues:   None noted  Skin Integrity:   [x]  Intact per flow sheets  Fluid Accumulation/Edema:  Edema: None    Physical Assessment: No muscle or fat wasting observed.     Diet and Intake History - Prior to Admission:  Current Diet Order: ADULT DIET; Regular; 4 carb choices (60 gm/meal)    Food Allergies: NKFA    Diet/Intake History: Pt reports good PO intake and appetite PTA. Familiar with consistent carb diet, does not follow at home. Drinks water, unsweetened tea, diet sodas.     Current  Appetite/PO Intake: Reduced appetite this admission. Agreeable to oral nutrition supplementation.     Subjective: Provided consistent carb diet education this date. Provided and discussed CHO counting for DM, using nutrition labels. Recommended pt aim for 60 g CHO @ meals/snacks. Reviewed importance of variety of fruits/vegetables, fibrous foods, and physical activity for DM mgmt. Discussed Plate Method for Diabetes.    Assessment of Current MNT: Diet is clinically appropriate but intake is inadequate to meet nutritional needs. Recommend adding oral nutrition supplements maximize calorie/protein opportunity. See nutrition recommendations.     Estimated Daily Nutrition Needs: 77.1 kg current wt  1542 - 1928 kcals (20-25 kcal/kg)   77 - 93 g protein (1-1.2 g/kg)   1542 mL fluid (20 ml/kg or per MD)     NUTRITION DIAGNOSIS:     Altered nutrition related labs related to endocrine dysfunction as evidenced by elevated blood glucose, hemoglobin A1c of 10.8 on 03/25/22.    NUTRITION INTERVENTION / RECOMMENDATIONS:     Continue 4CCHO diet.   Order Ensure Max Protein daily (provides 150 kcal, 30 g protein each)- chocolate.  Weekly wts to trend.   Discharge recommendations: Consistent Carb    Nutrition Education: 03/27/2022  Educated on Consistent Carb diet, Plate Method, Nutrition Label Reading  Learners:  Patient and Significant Other  Readiness: Eager  Method: Explanation, Handout, and Teachback  Response: Verbalizes Understanding and Demonstrated Understanding    NUTRITION MONITORING AND EVALUATION:     Nutrition Level of Care:  []  Low       [x]  Moderate    []  High    Monitoring and Evaluation: PO intake, wt trends, nutrition related labs, skin integrity, and if applicable, supplement acceptance and/or nutrition support tolerance.     Nutrition Goals:  Pt to meet/tolerate >75% of estimated needs, weight maintenance throughout LOS, BG control <180, Improvement and/or maintenance of skin integrity.    Code Status: Full Code      , RD, CNSC  03/27/22   Office: Ext [phone]  On-Call: [phone]

## 2022-03-27 NOTE — Progress Notes (Signed)
Bedside and Verbal shift change report given to Morrie Sheldon, RN & Ladona Ridgel, RN (oncoming nurse) by Kara Dies, RN (offgoing nurse). Report included the following information SBAR, Kardex, MAR and Recent Results.

## 2022-03-27 NOTE — Progress Notes (Signed)
[facility]        Progress Note    Name: Barbara Doyle   DOB: Apr 17, 1976   MRN: [id number]   Date: 03/27/2022    [x] I have reviewed the flowsheet and previous day's notes. Events, vitals, medications and notes from last 24 hours reviewed.       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  Out of window for tPA.  CTA head and neck with no large vessel occlusions.  -Mild DKA, blood sugar in the 400s on presentation, UA with ketones, protein, AG 19  - Nausea, vomiting and dizziness secondary to 1 and 2  -Large complex cystic lesion 24 x 16 x 24 cm on CT abdomen pelvis 03/25/2022 at [facility].    -SVT 03/26/2022.  Responded to IV metoprolol.  -Bradycardia with heart rate 38 on 03/26/2022 after labetalol p.o.  - Hypertension.  On labetalol and hydrochlorothiazide at home.  Echo 03/26/2022-EF 66%, mild LVH, RVSP 42  - Hx DM II, on Metformin  - Hx of Endometriosis  - Hx Anxiety    PLAN:   -Currently on RA. Supplemental O2 to keep SpO2 >90.   -Change neurochecks to every 2 hours.  STAT CT head for neuro change  -Continue Aspirin daily per neurology.  No Plavix at this time per neurology.  -Continue Lipitor 40 mg  -Continue Insulin drip per DKA protocol.  Transition insulin drip to Lantus via Glucomander protocol.  -Goal SBP 130-180 per neurology.    -DC labetalol.  Start low-dose metoprolol 12.5 p.o. twice daily.  Start Norvasc.  Restart Home hydrochlorothiazide.  Did not require Cardene.  We will use IV hydralazine as needed for systolic blood pressure more than 180.    -Regular diet per speech.  -Consider OB/GYN consultation to evaluate large complex cystic lesion on CT abdominal pelvis.  Defer to IM.  -Avoid nephrotoxins. Renally dose medications. Monitor urine output. Daily weights.  -Electrolyte replacements per protocol.    -Glucomander per protocol  -SUP: none  -DVT prophylaxis: SCDs and Heparin SQ  -If patient's heart rate and blood pressure remained stable by this afternoon will transfer to the tele neuro floor.Marland Kitchen.  PCCM will sign off when patient is out of ICU.  CC time 31 minutes.                Subjective:   Heart rate in high 30s after labetalol last evening.  Blood pressure still high.  Nausea vomiting and dizziness better.  Able to tolerate p.o.    ROS:   As above.  Rest negative.        Allergy:  Allergies   Allergen Reactions    Codeine Itching, Nausea Only and Rash     Throat gets tight      Hydrocodone Itching and Rash     Throat gets tight          Vital Signs:    BP (!) 181/108   Pulse 73   Temp 97.7 F (36.5 C) (Tympanic)   Resp 17   Ht 5' (1.524 m)   Wt 170 lb (77.1 kg)   LMP  (LMP Unknown)   SpO2 95%   BMI 33.20 kg/m             Temp (24hrs), Avg:97.5 F (36.4 C), Min:97 F (36.1 C), Max:98 F (  36.7 C)     Patient Vitals for the past 8 hrs:   Temp Pulse Resp BP SpO2   03/27/22 0600 -- 73 17 (!) 181/108 --   03/27/22 0500 -- 74 17 (!) 170/106 --   03/27/22 0400 -- 75 16 (!) 161/112 95 %   03/27/22 0347 97.7 F (36.5 C) -- -- -- --   03/27/22 0300 -- 75 16 (!) 150/87 96 %   03/27/22 0200 -- 81 17 (!) 164/108 91 %   03/27/22 0100 -- 73 18 (!) 151/86 92 %   03/27/22 0000 -- 80 20 (!) 157/98 93 %   03/26/22 2354 97.1 F (36.2 C) -- -- -- --   03/26/22 2300 -- 67 20 (!) 148/89 94 %         Intake/Output:   Last shift:      06/30 1901 - 07/01 0700  In: 120 [P.O.:120]  Out: 150 [Urine:150]  Last 3 shifts: 06/29 0701 - 06/30 1900  In: 1882.6 [I.V.:1632.6]  Out: 1472 [Urine:1472]    Intake/Output Summary (Last 24 hours) at 03/27/2022 0650  Last data filed at 03/26/2022 2109  Gross per 24 hour   Intake 956.18 ml   Output 850 ml   Net 106.18 ml         Ventilator Settings:  Mode Rate Tidal Volume Pressure FiO2 PEEP                       Physical Exam:    GENERAL: AAO x4 .   NECK: Supple. Trachea midline.    RESPIRATORY: Bilateral BS present, decreased at bases. No rales or rhonchi.   CARDIOVASCULAR: S1 and S2 present, regular. No murmur, rub, or thrill.   ABDOMEN: Soft and nontender with positive bowel sounds. No organomegaly.   NEUROLOGICAL: No focal deficits.   EXT: No edema or cyanosis.       DATA:   Current Facility-Administered Medications   Medication Dose Route Frequency    calcium gluconate 1,000 mg in sodium chloride 50 mL  1,000 mg IntraVENous PRN    Or    calcium gluconate 2,000 mg in sodium chloride 100 mL  2,000 mg IntraVENous PRN    Or    calcium gluconate 3,000 mg in sodium chloride 0.9 % 100 mL IVPB  3,000 mg IntraVENous PRN    Or    calcium gluconate 4,000 mg in sodium chloride 0.9 % 100 mL IVPB  4,000 mg IntraVENous PRN    Or    calcium gluconate 5,000 mg in sodium chloride 0.9 % 100 mL IVPB  5,000 mg IntraVENous PRN    Or    calcium gluconate 6,000 mg in sodium chloride 0.9 % 150 mL IVPB  6,000 mg IntraVENous PRN    potassium chloride 20 mEq/50 mL IVPB (Central Line)  20 mEq IntraVENous PRN    Or    potassium chloride 10 mEq/100 mL IVPB (Peripheral Line)  10 mEq IntraVENous PRN    magnesium sulfate 2000 mg in 50 mL IVPB premix  2,000 mg IntraVENous PRN    sodium phosphate 10 mmol in sodium chloride 0.9 % 250 mL IVPB  10 mmol IntraVENous PRN    Or    sodium phosphate 15 mmol in sodium chloride 0.9 % 250 mL IVPB  15 mmol IntraVENous PRN    Or    sodium phosphate 20 mmol in sodium chloride 0.9 % 500 mL IVPB  20 mmol IntraVENous PRN    hydrALAZINE (  APRESOLINE) injection 20 mg  20 mg IntraVENous Q6H PRN    dextrose 50 % IV solution  10-50 mL IntraVENous PRN    glucagon (rDNA) injection 1 mg  1 mg IntraMUSCular PRN    insulin glargine (LANTUS) injection vial 1-100 Units  1-100 Units SubCUTAneous QHS    insulin lispro (HUMALOG) injection vial 1-100 Units  1-100 Units SubCUTAneous 4x Daily AC & HS    insulin lispro (HUMALOG) injection vial 1-100 Units  1-100 Units SubCUTAneous PRN    sertraline (ZOLOFT)  tablet 25 mg  25 mg Oral Daily    labetalol (NORMODYNE) tablet 100 mg  100 mg Oral 2 times per day    dextrose 50 % IV solution  5-25 g IntraVENous PRN    glucagon (rDNA) injection 1 mg  1 mg IntraMUSCular PRN    atorvastatin (LIPITOR) tablet 40 mg  40 mg Oral Nightly    sodium chloride flush 0.9 % injection 5-40 mL  5-40 mL IntraVENous 2 times per day    sodium chloride flush 0.9 % injection 5-40 mL  5-40 mL IntraVENous PRN    0.9 % sodium chloride infusion   IntraVENous PRN    ondansetron (ZOFRAN-ODT) disintegrating tablet 4 mg  4 mg Oral Q8H PRN    Or    ondansetron (ZOFRAN) injection 4 mg  4 mg IntraVENous Q6H PRN    polyethylene glycol (GLYCOLAX) packet 17 g  17 g Oral Daily PRN    acetaminophen (TYLENOL) tablet 650 mg  650 mg Oral Q6H PRN    Or    acetaminophen (TYLENOL) suppository 650 mg  650 mg Rectal Q6H PRN    heparin (porcine) injection 5,000 Units  5,000 Units SubCUTAneous 3 times per day    aspirin chewable tablet 81 mg  81 mg Oral Daily             Labs:  Reviewed.   Recent Results (from the past 24 hour(s))   POCT Glucose    Collection Time: 03/26/22  7:14 AM   Result Value Ref Range    POC Glucose 135 (H) 65 - 105 mg/dL   POCT Glucose    Collection Time: 03/26/22  7:48 AM   Result Value Ref Range    POC Glucose 137 (H) 65 - 105 mg/dL   POCT Glucose    Collection Time: 03/26/22  8:57 AM   Result Value Ref Range    POC Glucose 149 (H) 65 - 105 mg/dL   POCT Glucose    Collection Time: 03/26/22 10:28 AM   Result Value Ref Range    POC Glucose 176 (H) 65 - 105 mg/dL   POCT Glucose    Collection Time: 03/26/22 11:42 AM   Result Value Ref Range    POC Glucose 185 (H) 65 - 105 mg/dL   EKG 12 Lead    Collection Time: 03/26/22  1:56 PM   Result Value Ref Range    Ventricular Rate 130 BPM    Atrial Rate 130 BPM    P-R Interval 130 ms    QRS Duration 80 ms    Q-T Interval 318 ms    QTC Calculation (Bezet) 467 ms    Calculated P Axis 67 degrees    Calculated R Axis -19 degrees    Calculated T Axis 68 degrees     DIAGNOSIS, 93000       Sinus tachycardia  Biatrial enlargement  Inferior infarct , age undetermined  Anterior infarct , age undetermined  Abnormal  ECG    Confirmed by Arrey-Mbi, M.D., [place] [phone]) on 03/26/2022 5:49:52 PM     Basic Metabolic Panel    Collection Time: 03/26/22  2:53 PM   Result Value Ref Range    Potassium 3.3 (L) 3.5 - 5.1 mEq/L    Chloride 105 98 - 107 mEq/L    Sodium 139 136 - 145 mEq/L    CO2 23 20 - 31 mEq/L    Glucose 184 (H) 74 - 106 mg/dl    BUN 16 9 - 23 mg/dl    Creatinine 9.75 8.83 - 1.02 mg/dl    GFR African American >60.0      GFR Non-African American >60      Calcium 10.2 8.7 - 10.4 mg/dl    Anion Gap 11 5 - 15 mmol/L   Magnesium    Collection Time: 03/26/22  2:53 PM   Result Value Ref Range    Magnesium 1.8 1.6 - 2.6 mg/dL   Phosphorus    Collection Time: 03/26/22  2:53 PM   Result Value Ref Range    Phosphorus 2.2 (L) 2.4 - 5.1 mg/dL   Calcium, Ionized    Collection Time: 03/26/22  2:53 PM   Result Value Ref Range    Calcium, Ionized 5.1 4.4 - 5.4 mg/dl   Troponin    Collection Time: 03/26/22  2:53 PM   Result Value Ref Range    Troponin, High Sensitivity 6 0 - 34 ng/L   POCT Glucose    Collection Time: 03/26/22  5:23 PM   Result Value Ref Range    POC Glucose 246 (H) 65 - 105 mg/dL   POCT Glucose    Collection Time: 03/26/22 10:05 PM   Result Value Ref Range    POC Glucose 201 (H) 65 - 105 mg/dL   CBC with Auto Differential    Collection Time: 03/27/22 12:53 AM   Result Value Ref Range    WBC 11.4 (H) 4.0 - 11.0 1000/mm3    RBC 4.86 3.60 - 5.20 M/uL    Hemoglobin 12.0 11.0 - 16.0 gm/dl    Hematocrit 25.4 98.2 - 47.0 %    MCV 77.6 (L) 80.0 - 98.0 fL    MCH 24.7 (L) 25.4 - 34.6 pg    MCHC 31.8 30.0 - 36.0 gm/dl    Platelets 641 (H) 583 - 450 1000/mm3    MPV 9.4 6.0 - 10.0 fL    RDW 40.5 36.4 - 46.3      Nucleated RBCs 0 0 - 0      Immature Granulocytes 0.4 0.0 - 3.0 %    Neutrophils Segmented 65.0 (H) 34 - 64 %    Lymphocytes 27.1 (L) 28 - 48 %    Monocytes 7.0 1 - 13 %    Eosinophils 0.2  0 - 5 %    Basophils 0.3 0 - 3 %   Basic Metabolic Panel    Collection Time: 03/27/22 12:53 AM   Result Value Ref Range    Potassium 3.8 3.5 - 5.1 mEq/L    Chloride 104 98 - 107 mEq/L    Sodium 138 136 - 145 mEq/L    CO2 26 20 - 31 mEq/L    Glucose 191 (H) 74 - 106 mg/dl    BUN 17 9 - 23 mg/dl    Creatinine 0.94 0.76 - 1.02 mg/dl    GFR African American >60.0      GFR Non-African American >60      Calcium  9.7 8.7 - 10.4 mg/dl    Anion Gap 9 5 - 15 mmol/L   Magnesium    Collection Time: 03/27/22 12:53 AM   Result Value Ref Range    Magnesium 1.8 1.6 - 2.6 mg/dL   Phosphorus    Collection Time: 03/27/22 12:53 AM   Result Value Ref Range    Phosphorus 3.1 2.4 - 5.1 mg/dL         Imaging:  Personally reviewed.           Faythe Ghee, MD      Dragon medical dictation software was used for portions of this report. Unintended voice transcription errors may have occurred.

## 2022-03-27 NOTE — Progress Notes (Signed)
NEUROLOGY PROGRESS NOTE       Patient: Barbara Doyle MRN: [id number]  SSN: [id number]    Date of Birth: 19-Jul-1976  Age: 46 y.o.  Sex: female        Chief Complaint : Acute stroke      Assessment / Plan:    46 y.o.female with history of prediabetes, anxiety was transferred from [facility] with vertigo, nausea/vomiting due to acute L PICA stroke.  MRI brain was done at the outside hospital and reported acute infarction in the inferior left cerebellar hemisphere.  CTA head/neck unremarkable for LVO or hemodynamically significant stenosis.  Echo: EF 66%.  No thrombus or intra-atrial shunting.  Suspect small vessel etiology for stroke.  Risk factors include hyperlipidemia, prediabetes with possible transition to overt diabetes, blood glucose 407 at [facility].  HbA1c 10.8.    No focal neurological deficits on exam.  Still complaining of vertigo which is worse with movement.     -Trial of meclizine 12.5 mg p.o. 3 times daily as needed for vertigo  -Continue aspirin 81 mg p.o. daily  - Atorvastatin 80 mg p.o. daily, LDL 152, HDL 41  - Diabetes management per hospitalist service  - Low threshold to repeat head imaging in case of neurological decline  - Neurochecks every 2 hours  - Telemetry  - PT/OT/ST  - Pharmacologic DVT prophylaxis  -No driving for 6 months in accordance with the [place] DMV recommendations regarding stroke after which patient will need to be cleared by a physician to resume driving.  - Follow-up with outpatient neurology in 2 to 3 weeks after discharge or sooner if clinically indicated  Neurology will sign off.  Please call with questions.      Subjective  Complaining of dizziness with movement.       Review of Systems  Positive for vertigo.  No chest pain, shortness of breath, nausea, vomiting, diarrhea, fever or chills.    Objective    Vitals:    03/27/22 0700 03/27/22 0800 03/27/22 0900 03/27/22 1000   BP: (!) 191/115 (!) 195/123 (!) 197/125 (!) 164/96   Pulse: 87 83 87 88   Resp: 20 13 28 20     Temp:       TempSrc:       SpO2: 98% 97% 98% 94%   Weight:       Height:            Neurological Examination  This is a well nourished middle-aged female who is resting in bed comfortably and appears to be in no acute distress  Awake and alert  Normal mood and affect  Oriented to age, month and reason for hospitalization  Answers questions and follows commands appropriately  No gaze deviation.   PERRL. Extraocular movements intact.   No gross visual field deficits  No facial asymmetry, tongue midline. Facial sensation intact to light touch bilaterally.  Motor tone normal.  No rigidity or spasticity.  No atrophy.  No gross weakness in bilateral upper and lower extremities  Sensation to light touch intact and symmetric bilaterally  Speech fluent, no dysarthria  Language intact.  No aphasia. Reading, repetition and naming intact.  No ataxia on finger-nose-finger and heel-knee-shin testing bilaterally  No inattention to bilateral simultaneous visual or tactile stimuli  Gait: deferred  NIHSS 0    Medications  Current Facility-Administered Medications   Medication Dose Route Frequency    metoprolol tartrate (LOPRESSOR) tablet 12.5 mg  12.5 mg Oral BID    amLODIPine (NORVASC) tablet  10 mg  10 mg Oral Daily    [START ON 03/28/2022] hydroCHLOROthiazide (HYDRODIURIL) tablet 12.5 mg  12.5 mg Oral Daily    calcium gluconate 1,000 mg in sodium chloride 50 mL  1,000 mg IntraVENous PRN    Or    calcium gluconate 2,000 mg in sodium chloride 100 mL  2,000 mg IntraVENous PRN    Or    calcium gluconate 3,000 mg in sodium chloride 0.9 % 100 mL IVPB  3,000 mg IntraVENous PRN    Or    calcium gluconate 4,000 mg in sodium chloride 0.9 % 100 mL IVPB  4,000 mg IntraVENous PRN    Or    calcium gluconate 5,000 mg in sodium chloride 0.9 % 100 mL IVPB  5,000 mg IntraVENous PRN    Or    calcium gluconate 6,000 mg in sodium chloride 0.9 % 150 mL IVPB  6,000 mg IntraVENous PRN    potassium chloride 20 mEq/50 mL IVPB (Central Line)  20 mEq  IntraVENous PRN    Or    potassium chloride 10 mEq/100 mL IVPB (Peripheral Line)  10 mEq IntraVENous PRN    magnesium sulfate 2000 mg in 50 mL IVPB premix  2,000 mg IntraVENous PRN    sodium phosphate 10 mmol in sodium chloride 0.9 % 250 mL IVPB  10 mmol IntraVENous PRN    Or    sodium phosphate 15 mmol in sodium chloride 0.9 % 250 mL IVPB  15 mmol IntraVENous PRN    Or    sodium phosphate 20 mmol in sodium chloride 0.9 % 500 mL IVPB  20 mmol IntraVENous PRN    hydrALAZINE (APRESOLINE) injection 20 mg  20 mg IntraVENous Q6H PRN    dextrose 50 % IV solution  10-50 mL IntraVENous PRN    glucagon (rDNA) injection 1 mg  1 mg IntraMUSCular PRN    insulin glargine (LANTUS) injection vial 1-100 Units  1-100 Units SubCUTAneous QHS    insulin lispro (HUMALOG) injection vial 1-100 Units  1-100 Units SubCUTAneous 4x Daily AC & HS    insulin lispro (HUMALOG) injection vial 1-100 Units  1-100 Units SubCUTAneous PRN    sertraline (ZOLOFT) tablet 25 mg  25 mg Oral Daily    dextrose 50 % IV solution  5-25 g IntraVENous PRN    glucagon (rDNA) injection 1 mg  1 mg IntraMUSCular PRN    atorvastatin (LIPITOR) tablet 40 mg  40 mg Oral Nightly    sodium chloride flush 0.9 % injection 5-40 mL  5-40 mL IntraVENous 2 times per day    sodium chloride flush 0.9 % injection 5-40 mL  5-40 mL IntraVENous PRN    0.9 % sodium chloride infusion   IntraVENous PRN    ondansetron (ZOFRAN-ODT) disintegrating tablet 4 mg  4 mg Oral Q8H PRN    Or    ondansetron (ZOFRAN) injection 4 mg  4 mg IntraVENous Q6H PRN    polyethylene glycol (GLYCOLAX) packet 17 g  17 g Oral Daily PRN    acetaminophen (TYLENOL) tablet 650 mg  650 mg Oral Q6H PRN    Or    acetaminophen (TYLENOL) suppository 650 mg  650 mg Rectal Q6H PRN    heparin (porcine) injection 5,000 Units  5,000 Units SubCUTAneous 3 times per day    aspirin chewable tablet 81 mg  81 mg Oral Daily       Imaging Results  Reviewed    Lab Results  Recent Results (from the past 24 hour(s))  POCT Glucose     Collection Time: 03/26/22 11:42 AM   Result Value Ref Range    POC Glucose 185 (H) 65 - 105 mg/dL   EKG 12 Lead    Collection Time: 03/26/22  1:56 PM   Result Value Ref Range    Ventricular Rate 130 BPM    Atrial Rate 130 BPM    P-R Interval 130 ms    QRS Duration 80 ms    Q-T Interval 318 ms    QTC Calculation (Bezet) 467 ms    Calculated P Axis 67 degrees    Calculated R Axis -19 degrees    Calculated T Axis 68 degrees    DIAGNOSIS, 93000       Sinus tachycardia  Biatrial enlargement  Inferior infarct , age undetermined  Anterior infarct , age undetermined  Abnormal ECG    Confirmed by Arrey-Mbi, M.D., [place] ([id number]) on 03/26/2022 5:49:52 PM     Basic Metabolic Panel    Collection Time: 03/26/22  2:53 PM   Result Value Ref Range    Potassium 3.3 (L) 3.5 - 5.1 mEq/L    Chloride 105 98 - 107 mEq/L    Sodium 139 136 - 145 mEq/L    CO2 23 20 - 31 mEq/L    Glucose 184 (H) 74 - 106 mg/dl    BUN 16 9 - 23 mg/dl    Creatinine 1.610.60 0.960.55 - 1.02 mg/dl    GFR African American >60.0      GFR Non-African American >60      Calcium 10.2 8.7 - 10.4 mg/dl    Anion Gap 11 5 - 15 mmol/L   Magnesium    Collection Time: 03/26/22  2:53 PM   Result Value Ref Range    Magnesium 1.8 1.6 - 2.6 mg/dL   Phosphorus    Collection Time: 03/26/22  2:53 PM   Result Value Ref Range    Phosphorus 2.2 (L) 2.4 - 5.1 mg/dL   Calcium, Ionized    Collection Time: 03/26/22  2:53 PM   Result Value Ref Range    Calcium, Ionized 5.1 4.4 - 5.4 mg/dl   Troponin    Collection Time: 03/26/22  2:53 PM   Result Value Ref Range    Troponin, High Sensitivity 6 0 - 34 ng/L   POCT Glucose    Collection Time: 03/26/22  5:23 PM   Result Value Ref Range    POC Glucose 246 (H) 65 - 105 mg/dL   POCT Glucose    Collection Time: 03/26/22 10:05 PM   Result Value Ref Range    POC Glucose 201 (H) 65 - 105 mg/dL   CBC with Auto Differential    Collection Time: 03/27/22 12:53 AM   Result Value Ref Range    WBC 11.4 (H) 4.0 - 11.0 1000/mm3    RBC 4.86 3.60 - 5.20 M/uL    Hemoglobin  12.0 11.0 - 16.0 gm/dl    Hematocrit 04.537.7 40.935.0 - 47.0 %    MCV 77.6 (L) 80.0 - 98.0 fL    MCH 24.7 (L) 25.4 - 34.6 pg    MCHC 31.8 30.0 - 36.0 gm/dl    Platelets 811459 (H) 914140 - 450 1000/mm3    MPV 9.4 6.0 - 10.0 fL    RDW 40.5 36.4 - 46.3      Nucleated RBCs 0 0 - 0      Immature Granulocytes 0.4 0.0 - 3.0 %    Neutrophils Segmented 65.0 (H) 34 -  64 %    Lymphocytes 27.1 (L) 28 - 48 %    Monocytes 7.0 1 - 13 %    Eosinophils 0.2 0 - 5 %    Basophils 0.3 0 - 3 %   Basic Metabolic Panel    Collection Time: 03/27/22 12:53 AM   Result Value Ref Range    Potassium 3.8 3.5 - 5.1 mEq/L    Chloride 104 98 - 107 mEq/L    Sodium 138 136 - 145 mEq/L    CO2 26 20 - 31 mEq/L    Glucose 191 (H) 74 - 106 mg/dl    BUN 17 9 - 23 mg/dl    Creatinine 0.25 4.27 - 1.02 mg/dl    GFR African American >60.0      GFR Non-African American >60      Calcium 9.7 8.7 - 10.4 mg/dl    Anion Gap 9 5 - 15 mmol/L   Magnesium    Collection Time: 03/27/22 12:53 AM   Result Value Ref Range    Magnesium 1.8 1.6 - 2.6 mg/dL   Phosphorus    Collection Time: 03/27/22 12:53 AM   Result Value Ref Range    Phosphorus 3.1 2.4 - 5.1 mg/dL   Magnesium    Collection Time: 03/27/22  7:50 AM   Result Value Ref Range    Magnesium 1.8 1.6 - 2.6 mg/dL   Phosphorus    Collection Time: 03/27/22  7:50 AM   Result Value Ref Range    Phosphorus 2.8 2.4 - 5.1 mg/dL   POCT Glucose    Collection Time: 03/27/22  7:59 AM   Result Value Ref Range    POC Glucose 154 (H) 65 - 105 mg/dL         Steffanie Dunn, MD  March 27, 2022, 10:29 AM

## 2022-03-27 NOTE — Progress Notes (Signed)
Doing well with heart rate in 70s to 80s.  Blood pressure 150s.  Will transfer to telemetry neuro floor.  We will sign off when patient is out of ICU

## 2022-03-28 LAB — POCT GLUCOSE
POC Glucose: 207 mg/dL — ABNORMAL HIGH (ref 65–105)
POC Glucose: 165 mg/dL — ABNORMAL HIGH (ref 65–105)
POC Glucose: 246 mg/dL — ABNORMAL HIGH (ref 65–105)
POC Glucose: 178 mg/dL — ABNORMAL HIGH (ref 65–105)

## 2022-03-28 LAB — CBC WITH AUTO DIFFERENTIAL
Basophils: 0.4 % (ref 0–3)
Eosinophils: 0.3 % (ref 0–5)
Hematocrit: 38.9 % (ref 35.0–47.0)
Hemoglobin: 12.7 gm/dl (ref 11.0–16.0)
Immature Granulocytes %: 0.1 % (ref 0.0–3.0)
Lymphocytes: 35.3 % (ref 28–48)
MCH: 24.6 pg — ABNORMAL LOW (ref 25.4–34.6)
MCHC: 32.6 gm/dl (ref 30.0–36.0)
MCV: 75.4 fL — ABNORMAL LOW (ref 80.0–98.0)
MPV: 9.1 fL (ref 6.0–10.0)
Monocytes: 6.9 % (ref 1–13)
Neutrophils Segmented: 57 % (ref 34–64)
Nucleated RBCs: 0 (ref 0–0)
Platelets: 416 10*3/uL (ref 140–450)
RBC: 5.16 M/uL (ref 3.60–5.20)
RDW: 38.5 (ref 36.4–46.3)
WBC: 7.8 10*3/uL (ref 4.0–11.0)

## 2022-03-28 LAB — BASIC METABOLIC PANEL
Anion Gap: 8 mmol/L (ref 5–15)
BUN: 11 mg/dl (ref 9–23)
CO2: 30 mEq/L (ref 20–31)
Calcium: 9.6 mg/dl (ref 8.7–10.4)
Chloride: 97 mEq/L — ABNORMAL LOW (ref 98–107)
Creatinine: 0.53 mg/dl — ABNORMAL LOW (ref 0.55–1.02)
GFR African American: >60
GFR Non-African American: >60
Glucose: 165 mg/dl — ABNORMAL HIGH (ref 74–106)
Potassium: 3.2 mEq/L — ABNORMAL LOW (ref 3.5–5.1)
Sodium: 135 mEq/L — ABNORMAL LOW (ref 136–145)

## 2022-03-28 MED FILL — SERTRALINE HCL 50 MG PO TABS: 50 MG | ORAL | Qty: 1

## 2022-03-28 MED FILL — ATORVASTATIN CALCIUM 40 MG PO TABS: 40 MG | ORAL | Qty: 2

## 2022-03-28 MED FILL — AMLODIPINE BESYLATE 5 MG PO TABS: 5 MG | ORAL | Qty: 2

## 2022-03-28 MED FILL — HYDROCHLOROTHIAZIDE 25 MG PO TABS: 25 MG | ORAL | Qty: 1

## 2022-03-28 MED FILL — HEPARIN SODIUM (PORCINE) 5000 UNIT/ML IJ SOLN: 5000 UNIT/ML | INTRAMUSCULAR | Qty: 1

## 2022-03-28 MED FILL — ONDANSETRON 4 MG PO TBDP: 4 MG | ORAL | Qty: 1

## 2022-03-28 MED FILL — ACETAMINOPHEN 325 MG PO TABS: 325 MG | ORAL | Qty: 2

## 2022-03-28 MED FILL — LABETALOL HCL 100 MG PO TABS: 100 MG | ORAL | Qty: 1

## 2022-03-28 MED FILL — DIAZEPAM 2 MG PO TABS: 2 MG | ORAL | Qty: 1

## 2022-03-28 MED FILL — ASPIRIN 81 MG PO CHEW: 81 MG | ORAL | Qty: 1

## 2022-03-28 NOTE — Progress Notes (Signed)
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 Trial of Valium  Diabetic ketoacidosis  A1c 10.8, resolved.   Large complex cystic lesion 24 x 16 x 24 cm on CT abdomen pelvis 03/25/2022 at [facility].  D/W pt- will need to see her GYN when she returns to [facility]  Hypertension   Type 2 diabetes mellitus. Had been on Metformin. Will need addition of insulin for now  History of endometriosis  History of anxiety  DVT proph: sc Heparin  Dispo: Improving. I've asked RN to instruct on insulin administration today & plan to D/C tom  Subjective:   Vertigo & nausea improved. No HA      Respiratory ROS: no cough, shortness of breath,pleutic chest pain or wheezing  Cardiovascular ROS: no chest pain , dyspnea on exertion, orthopnea  Gastrointestinal ROS: no abdominal pain, nausea,vomiting, diarrhea, melena or hematechezia  Genito-Urinary ROS: no dysuria, trouble voiding, or hematuria  Objective:   Physical Exam:     BP (!) 146/84   Pulse 85   Temp 98.6 F (37 C) (Oral)   Resp 19   Ht 5' (1.524 m)   Wt 169 lb 1.6 oz (76.7 kg)   LMP  (LMP Unknown)   SpO2 94%   BMI 33.03 kg/m         Temp (24hrs), Avg:98.1 F (36.7 C), Min:97.5 F (36.4 C), Max:99 F (37.2 C)    No intake/output data recorded.   06/30 1901 - 07/02 0700  In: 195 [P.O.:195]  Out: 2150 [Urine:2150]    General:  Alert, cooperative, no distress, appears stated age.   Lungs:   Clear to auscultation bilaterally.    :     Heart:  Regular rate and rhythm, S1, S2 normal, no murmur, click, rub or gallop.   Abdomen:   Soft, non-tender. Bowel sounds normal. No masses,  No organomegaly.   Extremities:  FROM, no LE edema   Pulses: 2+ and symmetric all extremities.   Skin: Skin color, texture, turgor normal. No rashes or lesions   :      Data Review:       24  Hour Results:  Recent Results (from the past 24 hour(s))   POCT Glucose    Collection Time: 03/27/22  5:27 PM   Result Value Ref Range    POC Glucose 215 (H) 65 - 105 mg/dL   POCT Glucose    Collection Time: 03/27/22  9:21 PM   Result Value Ref Range    POC Glucose 207 (H) 65 - 105 mg/dL   CBC with Auto Differential    Collection Time: 03/28/22  2:48 AM   Result Value Ref Range    WBC 7.8 4.0 - 11.0 1000/mm3    RBC 5.16 3.60 - 5.20 M/uL    Hemoglobin 12.7 11.0 - 16.0 gm/dl    Hematocrit 63.8 17.7 - 47.0 %    MCV 75.4 (L) 80.0 - 98.0 fL    MCH 24.6 (L) 25.4 - 34.6 pg    MCHC 32.6 30.0 - 36.0 gm/dl    Platelets 116 579 - 450 1000/mm3    MPV 9.1 6.0 - 10.0 fL    RDW 38.5 36.4 - 46.3      Nucleated RBCs 0 0 - 0  Immature Granulocytes 0.1 0.0 - 3.0 %    Neutrophils Segmented 57.0 34 - 64 %    Lymphocytes 35.3 28 - 48 %    Monocytes 6.9 1 - 13 %    Eosinophils 0.3 0 - 5 %    Basophils 0.4 0 - 3 %   Basic Metabolic Panel    Collection Time: 03/28/22  2:48 AM   Result Value Ref Range    Potassium 3.2 (L) 3.5 - 5.1 mEq/L    Chloride 97 (L) 98 - 107 mEq/L    Sodium 135 (L) 136 - 145 mEq/L    CO2 30 20 - 31 mEq/L    Glucose 165 (H) 74 - 106 mg/dl    BUN 11 9 - 23 mg/dl    Creatinine 1.91 (L) 0.55 - 1.02 mg/dl    GFR African American >60.0      GFR Non-African American >60      Calcium 9.6 8.7 - 10.4 mg/dl    Anion Gap 8 5 - 15 mmol/L   POCT Glucose    Collection Time: 03/28/22  8:30 AM   Result Value Ref Range    POC Glucose 165 (H) 65 - 105 mg/dL   POCT Glucose    Collection Time: 03/28/22 11:26 AM   Result Value Ref Range    POC Glucose 246 (H) 65 - 105 mg/dL       Problem List:       Medications reviewed  Current Facility-Administered Medications   Medication Dose Route Frequency    amLODIPine (NORVASC) tablet 10 mg  10 mg Oral Daily    atorvastatin (LIPITOR) tablet 80 mg  80 mg Oral Nightly    meclizine (ANTIVERT) tablet 12.5 mg  12.5 mg Oral TID PRN    hydroCHLOROthiazide (HYDRODIURIL) tablet 12.5 mg  12.5 mg Oral  Daily    labetalol (NORMODYNE) tablet 100 mg  100 mg Oral 2 times per day    diazePAM (VALIUM) tablet 2 mg  2 mg Oral Q8H PRN    calcium gluconate 1,000 mg in sodium chloride 50 mL  1,000 mg IntraVENous PRN    Or    calcium gluconate 2,000 mg in sodium chloride 100 mL  2,000 mg IntraVENous PRN    Or    calcium gluconate 3,000 mg in sodium chloride 0.9 % 100 mL IVPB  3,000 mg IntraVENous PRN    Or    calcium gluconate 4,000 mg in sodium chloride 0.9 % 100 mL IVPB  4,000 mg IntraVENous PRN    Or    calcium gluconate 5,000 mg in sodium chloride 0.9 % 100 mL IVPB  5,000 mg IntraVENous PRN    Or    calcium gluconate 6,000 mg in sodium chloride 0.9 % 150 mL IVPB  6,000 mg IntraVENous PRN    potassium chloride 20 mEq/50 mL IVPB (Central Line)  20 mEq IntraVENous PRN    Or    potassium chloride 10 mEq/100 mL IVPB (Peripheral Line)  10 mEq IntraVENous PRN    magnesium sulfate 2000 mg in 50 mL IVPB premix  2,000 mg IntraVENous PRN    sodium phosphate 10 mmol in sodium chloride 0.9 % 250 mL IVPB  10 mmol IntraVENous PRN    Or    sodium phosphate 15 mmol in sodium chloride 0.9 % 250 mL IVPB  15 mmol IntraVENous PRN    Or    sodium phosphate 20 mmol in sodium chloride 0.9 % 500 mL IVPB  20 mmol  IntraVENous PRN    hydrALAZINE (APRESOLINE) injection 20 mg  20 mg IntraVENous Q6H PRN    dextrose 50 % IV solution  10-50 mL IntraVENous PRN    glucagon (rDNA) injection 1 mg  1 mg IntraMUSCular PRN    insulin glargine (LANTUS) injection vial 1-100 Units  1-100 Units SubCUTAneous QHS    insulin lispro (HUMALOG) injection vial 1-100 Units  1-100 Units SubCUTAneous 4x Daily AC & HS    insulin lispro (HUMALOG) injection vial 1-100 Units  1-100 Units SubCUTAneous PRN    sertraline (ZOLOFT) tablet 25 mg  25 mg Oral Daily    dextrose 50 % IV solution  5-25 g IntraVENous PRN    glucagon (rDNA) injection 1 mg  1 mg IntraMUSCular PRN    sodium chloride flush 0.9 % injection 5-40 mL  5-40 mL IntraVENous 2 times per day    sodium chloride flush 0.9  % injection 5-40 mL  5-40 mL IntraVENous PRN    0.9 % sodium chloride infusion   IntraVENous PRN    ondansetron (ZOFRAN-ODT) disintegrating tablet 4 mg  4 mg Oral Q8H PRN    Or    ondansetron (ZOFRAN) injection 4 mg  4 mg IntraVENous Q6H PRN    polyethylene glycol (GLYCOLAX) packet 17 g  17 g Oral Daily PRN    acetaminophen (TYLENOL) tablet 650 mg  650 mg Oral Q6H PRN    Or    acetaminophen (TYLENOL) suppository 650 mg  650 mg Rectal Q6H PRN    heparin (porcine) injection 5,000 Units  5,000 Units SubCUTAneous 3 times per day    aspirin chewable tablet 81 mg  81 mg Oral Daily        Care Plan discussed with: pt & RN    Total time spent with patient: 35 minutes.    Dorothe Pea, MD  March 28, 2022  2:27 PM

## 2022-03-28 NOTE — Progress Notes (Signed)
PHYSICAL THERAPY EVALUATION     Acknowledge Orders  Time  PT Charge Capture  Rehab Caseload Tracker  Tinetti  -    Patient: Barbara Doyle (46 y.o. female)  Room: 6608/6608    Primary Diagnosis: Essential hypertension [I10]  Acute ischemic left posterior cerebral artery (PCA) stroke (HCC) [I63.532]  Diabetic ketoacidosis without coma associated with diabetes mellitus due to underlying condition (HCC) [E08.10]  Ischemic cerebrovascular accident (CVA) (HCC) [I63.9]  Acute stroke due to ischemia [facility]) [I63.9]       Date of Admission: 03/25/2022   Length of Stay:  3 day(s)  Insurance: Payor: BCBS / Plan: BCBS OUT OF STATE / Product Type: *No Product type* /      Date: 03/28/2022  Time In: 1050       Time Out: 1115   Total Minutes: 25  Treatment Time: Patient received/participated in  minutes of treatment  functional mobility training, bed mobility training, transfer training, education for positioning, gait training, therapeutic activities, patient education, caregiver education) during/immediately following PT evaluation.    Isolation:  No active isolations       MDRO: No active infections  Current diet order: ADULT DIET; Regular; 4 carb choices (60 gm/meal)  ADULT ORAL NUTRITION SUPPLEMENT; Dinner; Diabetic Oral Supplement    Precautions: falls   ASSESSMENT:      Modified Rankin Score (mRS): 2 - Slight disability:  unable to carry out all previous activities, but able to look after own affairs without assistance.     Based on the objective data described below, the patient presents with    - Good tolerance to activity  - OOB activity with assist x 1  - active participation in gait training 341ft using no AD, SBA for general improved safety  - motivated to increase activity  - complaint of nausea  - impaired balance during gait with head turning R/L  - Supervision for bed mobility and transfers    Patient is below baseline functional mobility and will benefit from skilled PT intervention in the acute care setting to  address the above mentioned impairments.    Patient's rehabilitation potential for below stated goals: good    Recommendations:  Recommend continued physical therapy during acute stay. Recommend out of bed activity to counteract ill effects of bedrest, with assistance from staff/ family as needed.  Discharge Recommendations: Home with family support, Outpatient PT.  Further Equipment Recommendations for Discharge: No DME needs.     PRIOR LEVEL OF FUNCTION / HOME ENVIRONMENT:      Information was obtained by patient and patient's spouse at bedside    Home environment:  Patient lives with spouse in a 2 story home. Bedroom is on the first floor. 2 steps to enter. (0) rails.  Prior level of function: independent, employed (full time)  Home equipment: None reported    PLAN:      Patient will benefit from skilled Physical Therapy intervention to address the above impairments to return to prior level of function. PT Plan of Care: 3 times/week, 5 times/week.    Patient will achieve PT goals in 1 week. Goals were created with input from the patient.    PHYSICAL THERAPY GOALS:     - Patient will be independent with bed mobility in preparation for EOB activities.   - Patient will be independent with transfers in preparation for OOB activities and ambulation.  - Patient will tolerate sitting up in chair for meals.  - Patient will be independent for ambulating 300  feet with no AD and good balance to promote functional independence at home.   - Patient will be independent with ascending/descending 2 stairs with handrail(s) to enter home and/or go to upstairs bedroom/bathroom.  - Patient will demonstrate good balance to safely enable upright activities and reduce risk for falls.  - Patient will demonstrate good activity tolerance during functional activities.  - Patient will demonstrate good safety awareness during functional activities.  - Patient will utilize energy conservation techniques during functional activities.  -  Patient will increase FOM score to 28 in order to increase independence and safety with functional mobility.     PLANNED INTERVENTIONS:      Skilled Physical Therapy services will provide functional mobility training, therapeutic exercises, therapeutic activities, patient/caregiver education as indicated.  Skilled Physical Therapy services will modify and progress therapeutic interventions, address functional mobility deficits, address ROM and strength deficits, analyze and cue movement patterns and assess and modify postural abnormalities to reach the stated goals.    COMMUNICATION/EDUCATION:     Education: Ill effects of bedrest, benefits of activity, OOB activity as tolerated, with assist from staff as appropriate, OOB to chair for meals and mobilize as tolerated, activity as tolerated to counteract ill effects of bedrest, promote healing and return to prior level of function, call staff for assistance, safety, activity pacing, energy conservation, home safety: good lighting and remove throw rugs, clutter for safe mobility at home, disposition, role of PT, PT plan of care, all questions answered.    Education provided to: patient  Opportunity for questions and clarification was provided.    Readiness to learn indicated by: verbalized understanding    Barriers to learning/limitations: none    Comprehension: Patient communicated comprehension      SUBJECTIVE:      Patient agreeable to participate.   Patient reports neck stiffness. Pain not rated.    OBJECTIVE DATA SUMMARY:      Orders, labs, and chart reviewed on Barbara Doyle. Communicated with patient's nurse (patient ok to be seen by PT).     Present illness history:   Patient Active Problem List    Diagnosis Date Noted    Acute stroke due to ischemia (HCC) 03/25/2022    Ischemic cerebrovascular accident (CVA) (HCC) 03/25/2022      Previous medical history:   Past Medical History:   Diagnosis Date    Anxiety     Diabetes (HCC)     Endometriosis     Hypertension          PATIENT FOUND:     Semi reclined in bed. (+) family present.    COGNITIVE STATUS:     Mental Status:  Oriented x3   Communication:  normal   Follows commands:  follows one step commands/direction   General cognition:  intact   Safety/Judgement:  appropriate awareness of environment and need for assistance       EXTREMITIES ASSESSMENT:      Range Of Motion:  BUE: WFL  BLE:  range of motion is WFL.      Strength:    BUE: WFL  RLE: Strength is grossly graded as 5/5  LLE: Strength is grossly graded as 5/5    Sensation:  Intact to light touch    Tone  normal      THERAPEUTIC ACTIVITIES; FUNCTIONAL MOBILITY AND BALANCE STATUS:      Bed mobility:  Scooting: supervision; - bed flat  Sup->sit: supervision;  Sit->supine:  supervision    Transfers:  Sit -> stand:  supervision using no AD   Stand -> sit: Supervision      Gait Training:  300 feet, standby assist  steady, reciprocal gait pattern, decreased walking speed, mild gait instability with head turns , and cueing for posture, proper body mechanics, increased step length and feedback regarding walking speed to improve gait speed outcome, activity pacing, and safety, gait belt    Stair Training:   patient declined stair training     Balance:   Static sitting balance:          normal       Dynamic sitting balance:     normal       Static standing balance:      good      Dynamic standing balance: good-           Functional Outcome Measure:  Tinetti Balance    Sitting Balance: Steady, safe  Arises: Able without using arms  Attempts to Arise: Able to arise, one attempt  Immediate Standing Balance (First 5 Seconds): Steady without walker or other support  Standing Balance: Steady but wide stance, uses cane or other support  Nudged: Staggers, grabs, catches self  Eyes Closed: Unsteady  Turned 360 Degrees: Steadiness: Steady  Turned 360 Degrees: Continuity of Steps: Continuous  Sitting Down: Safe, smooth motion  Balance Score: 13/16 Initiation of Gait: No hesitancy  Step  Height: R Swing Foot: Right foot complete clears floor  Step Length: R Swing Foot: Passes left stance foot  Step Height: L Swing Foot: Left foot complete clears floor  Step Length: L Swing Foot: Passes right stance foot  Step Symmetry: Right and left step appear equal  Step Continuity: Steps appear continuous  Path: Straight without walking aid  Trunk: No sway but flexion of knees or back or spreads arms out while walking  Walking Time: Heels almost touching while walking  Gait Score: 11/12     Current Score: 24 / 28 (Date: 03/28/2022)    Interpretation of Score: Tinetti is separated into a gait score and balance score. The higher the patient's score, the more independent/lower fall risk. Score interpretation: 25-28 = low fall risk;  19-24 = medium fall risk;  < 19 = high fall risk.    ACTIVITY TOLERANCE:     - good tolerance to activity during PT session  - no apparent distress    FINAL LOCATION:     Positioned in bed, all needs within reach. Patient agrees to call for assistance. Positioned with pillows for comfort. (+) bed alarm. (+) spouse present. (+) family present.    Thank you for this referral.  Darrick Grinder, PT DPT

## 2022-03-29 LAB — POCT GLUCOSE
POC Glucose: 181 mg/dL — ABNORMAL HIGH (ref 65–105)
POC Glucose: 144 mg/dL — ABNORMAL HIGH (ref 65–105)
POC Glucose: 189 mg/dL — ABNORMAL HIGH (ref 65–105)

## 2022-03-29 MED ORDER — BLOOD GLUCOSE TEST VI STRP
ORAL_STRIP | 0 refills | Status: AC
Start: 2022-03-29 — End: ?

## 2022-03-29 MED ORDER — FREESTYLE FREEDOM KIT
PACK | Freq: Every day | 0 refills | Status: AC
Start: 2022-03-29 — End: ?

## 2022-03-29 MED ORDER — ONDANSETRON 4 MG PO TBDP
4 MG | ORAL_TABLET | Freq: Three times a day (TID) | ORAL | 0 refills | Status: AC | PRN
Start: 2022-03-29 — End: ?

## 2022-03-29 MED ORDER — MAXI-COMFORT INSULIN SYRINGE 28G X 1/2" 1 ML MISC
0 refills | Status: AC
Start: 2022-03-29 — End: ?

## 2022-03-29 MED ORDER — ASPIRIN 81 MG PO CHEW
81 MG | ORAL_TABLET | Freq: Every day | ORAL | 0 refills | Status: AC
Start: 2022-03-29 — End: ?

## 2022-03-29 MED ORDER — INSULIN LISPRO 100 UNIT/ML IJ SOLN
100 UNIT/ML | Freq: Three times a day (TID) | INTRAMUSCULAR | 0 refills | Status: AC
Start: 2022-03-29 — End: ?

## 2022-03-29 MED ORDER — ATORVASTATIN CALCIUM 40 MG PO TABS
40 MG | ORAL_TABLET | Freq: Every evening | ORAL | 0 refills | Status: AC
Start: 2022-03-29 — End: ?

## 2022-03-29 MED ORDER — INSULIN GLARGINE 100 UNIT/ML SC SOLN
100 UNIT/ML | Freq: Every evening | SUBCUTANEOUS | 0 refills | Status: AC
Start: 2022-03-29 — End: ?

## 2022-03-29 MED ORDER — LANCETS MISC
0 refills | Status: AC
Start: 2022-03-29 — End: ?

## 2022-03-29 MED ORDER — DIAZEPAM 5 MG PO TABS
5 MG | ORAL_TABLET | Freq: Three times a day (TID) | ORAL | 0 refills | Status: AC | PRN
Start: 2022-03-29 — End: 2022-04-08

## 2022-03-29 MED ORDER — LOSARTAN POTASSIUM 50 MG PO TABS
50 MG | ORAL_TABLET | Freq: Every day | ORAL | 0 refills | Status: AC
Start: 2022-03-29 — End: ?

## 2022-03-29 MED FILL — LABETALOL HCL 100 MG PO TABS: 100 MG | ORAL | Qty: 1

## 2022-03-29 MED FILL — SERTRALINE HCL 50 MG PO TABS: 50 MG | ORAL | Qty: 1

## 2022-03-29 MED FILL — ATORVASTATIN CALCIUM 40 MG PO TABS: 40 MG | ORAL | Qty: 2

## 2022-03-29 MED FILL — ACETAMINOPHEN 325 MG PO TABS: 325 MG | ORAL | Qty: 2

## 2022-03-29 MED FILL — HEPARIN SODIUM (PORCINE) 5000 UNIT/ML IJ SOLN: 5000 UNIT/ML | INTRAMUSCULAR | Qty: 1

## 2022-03-29 MED FILL — SODIUM CHLORIDE FLUSH 0.9 % IV SOLN: 0.9 % | INTRAVENOUS | Qty: 10

## 2022-03-29 MED FILL — DIAZEPAM 2 MG PO TABS: 2 MG | ORAL | Qty: 1

## 2022-03-29 MED FILL — AMLODIPINE BESYLATE 5 MG PO TABS: 5 MG | ORAL | Qty: 2

## 2022-03-29 MED FILL — ONDANSETRON 4 MG PO TBDP: 4 MG | ORAL | Qty: 1

## 2022-03-29 MED FILL — HYDROCHLOROTHIAZIDE 25 MG PO TABS: 25 MG | ORAL | Qty: 1

## 2022-03-29 MED FILL — MECLIZINE HCL 12.5 MG PO TABS: 12.5 MG | ORAL | Qty: 1

## 2022-03-29 MED FILL — ASPIRIN 81 MG PO CHEW: 81 MG | ORAL | Qty: 1

## 2022-03-29 NOTE — Other (Addendum)
Patient request at discharge. Prescriptions to be filled at [facility] outpatient pharmacy please.  Rx for insulin in vials  Rx for 1 cc insulin syringes  Rx for glucometer kit  Rx for glucometer strips  Rx for lancets    Request for diabetes education complete. Patient admitted for c/o dizziness with initial BG of 355 and A1c of 10.8%.  Patient reports being told she has pre-diabetes by her PCP. She was diagnosed and was prescribed Metformin a couple of years ago. She states she cannot tolerate the GI side effects of Metformin.    Insulin administration teaching complete using vial and syringe as well as insulin pens. Patient worried about the cost of insulin pens so would like to start with vial and syringe and have her PCP switch over to pens if covered at a reasonable cost by her insurance company. Patient's husband comfortable with vial and syringe as this is how he administered her hormone shots.    Diabetes education book given to patient with emphasis placed on pages that contain instructions and pictures of insulin delivery.  Insulin storage, site selection, site rotation, air shot and sharps disposal discussed. Basic nutrition information provided using a 45 -60 gram carb count to get started.    Patient and husband very pleasant and receptive to information provided today. They have no more questions for me at this time but if needed the bedside nurse will call me.

## 2022-03-29 NOTE — Progress Notes (Signed)
Discharge instructions prepared and primary nurse printed and will review with patient.

## 2022-03-29 NOTE — Progress Notes (Signed)
OCCUPATIONAL THERAPY EVALUATION   Acknowledge Orders  Time  OT Charge Capture  Rehab Caseload Tracker  -    Patient: Barbara Doyle (46 y.o. female)  Room: 6608/6608    Primary Diagnosis: Essential hypertension [I10]  Acute ischemic left posterior cerebral artery (PCA) stroke (HCC) [I63.532]  Diabetic ketoacidosis without coma associated with diabetes mellitus due to underlying condition (HCC) [E08.10]  Ischemic cerebrovascular accident (CVA) (HCC) [I63.9]  Acute stroke due to ischemia [facility]) [I63.9]       Date of Admission: 03/25/2022   Length of Stay:  4 day(s)  Insurance: Payor: BCBS / Plan: BCBS OUT OF STATE / Product Type: *No Product type* /      Date: 03/29/2022  Time In: 1045        Time Out: 1055       Total Minutes: 10   Treatment time: 0 minutes    Isolation:  No active isolations       MDRO: No active infections    Precautions: falls   Ordered weight bearing status: none    Current diet order: ADULT DIET; Regular; 4 carb choices (60 gm/meal)  ADULT ORAL NUTRITION SUPPLEMENT; Dinner; Diabetic Oral Supplement    ASSESSMENT     Modified Rankin Score (mRS): 1 - No significant disability despite symptoms; However able to carry out usual duties    Based on the objective data described below, the patient presents near/at baseline ADL performance this date.     -  patient is able to perform basic self care tasks without assistance.  -  patient is supervision for functional standing, transfers and bed mobility.   -  patient has a supportive spouse at home to assist them as needed.  -  at this time, no further skilled Occupational Therapy needed during acute stay .  OT to d/c from caseload.    No further skilled OT indicated at this time. Will sign off. Please reconsult if patient condition changes.    Recommendations:  EVAL ONLY - SIGN OFF  No skilled occupational therapy intervention recommended during acute stay.    Discharge Recommendations:  No OT needs at d/c  Equipment Recommendations for Discharge: none  identified at this time     PRIOR LEVEL OF FUNCTION     Information was obtained by: patient  Home environment: Patient lives with spouse in a 2 story home.  Prior level of function: Patient is independent with no limitations.  Prior level of Instrumental Activities of Daily Living: Patient is independent with working , driving, works at a bank.  Home equipment: none reported    PLAN      The role of Occupational Therapy was explained to the patient and that the evaluation would consist of assessing items such as; self-care, mobility, cognition and upper extremity skills. Patient presents at functional baseline this date for ADL/IADL performance. Patient in agreement to discontinue occupational therapy services at this time. If patient has a change in medical status or becomes medically appropriate for skilled occupational therapy, please re-order services. Thank you!    Patient will be discharged from Occupational Therapy services at this time.    EDUCATION/COMMUNICATION     Barriers to learning/limitations: None  Education provided XB:MWUXLKG, spouse on (+) role of OT, (+) OT plan of care, (+) Instructed patient in the benefits of maintaining activity tolerance, functional mobility, and independence with self care tasks during acute stay  to ensure safe return home and to baseline. Encouraged patient to increase frequency and  duration OOB, be out of bed for all meals, perform daily ADLs (as approved by RN/MD regarding bathing etc), and performing functional mobility to/from bathroom with staff assistance as needed., (+) instructed patient on the importance of activity while hospitalized to prevent a decline in function, (+) encouraged patient to sit up in chair for 45 (+) minutes or as tolerated 2-3 times a day, with staff assistance as needed, (+) the importance of maintaining UE muscle strength and activity tolerance while hospitalized to prevent a decline in function, (+) staff assistance with mobility, (+)  change positions frequently, (+) functional mobility, (+) discharge disposition/recommendations, (+) ADL training, (+) safety  Educational handouts issued: none this session  Patient / family response to education: verbalized understanding, demonstrated understanding    SUBJECTIVE     Patient "I'm just anxious for the car ride home"    Pain Assessment: none reported, none observed    OBJECTIVE DATA SUMMARY     Orders, labs, and chart reviewed on Brittanee [place]. Communicated with nursing staff. Patient cleared to participate in Occupational Therapy evaluation.    Patient was admitted to the hospital on 03/25/2022 with   Chief Complaint   Patient presents with    Dizziness     Present illness history:   Patient Active Problem List    Diagnosis Date Noted    Acute stroke due to ischemia (HCC) 03/25/2022    Ischemic cerebrovascular accident (CVA) (HCC) 03/25/2022      Previous medical history:   Past Medical History:   Diagnosis Date    Anxiety     Diabetes (HCC)     Endometriosis     Hypertension        PATIENT FOUND     Bed, (-) bed/chair exit alarm, (+) visitor: spouse, (+) telemetry    COGNITIVE STATUS     Mental status:  Orientation: Patient is oriented x 3   Neuro state: awake, alert, and pleasant  Communication: normal, speech and language intact  Attention Span:  normal  General Cognition:  intact   Follows commands: intact  Safety/Judgement: good safety awareness  Hearing:   normal  Vision:   normal    UPPER EXTREMITY ASSESSMENT     Dominance:right  RIGHT:  ACTIVE range of motion is WNL Strength is grossly graded as  5/5   LEFT:   ACTIVE range of motion is WNL  Strength is grossly graded as 5/5       Comment(s):   RIGHT upper extremity: no c/o paresthesia, intact coordination  LEFT   upper extremity: no c/o paresthesia, intact coordination    ACTIVITIES OF DAILY LIVING     Based on direct observation, simulation and clinical assessment.  (clincial judgement based on: activity tolerance, balance, safety awareness,  cognition, functional strength/ROM/coordination of all extremities)    Eating:           - independent, set-up in hospital setting  Grooming:     - independent, set-up in hospital setting  UB bathing:   - supervision  LB bathing:   -  supervision  UB dressing: - supervision  LB dressing: - supervision  Toileting:       - supervision    FUNCTIONAL MOBILITY STATUS     Mobility:  Supine to sit -  independent  Sit to supine -  independent  Sit to stand -  supervision  Stand to sit -  supervision  Functional ambulation in room: supervision    BALANCE AND ACTIVITY TOLERANCE  Static Sitting Balance -           good:       maintains balance against moderate resistance  Dynamic Sitting Balance -      good:       independent with functional dynamic balance activities   Static Standing Balance -       good (-):  maintains balance against minimum resistance  Dynamic Standing Balance -  good (-):  independent with basic dynamic balance activities     Activity Tolerance:   - good tolerance to activity during OT session  - motivated to increase activity    FUNCTIONAL ASSESSMENT     Myrtis Ser Index of Independence in Activities of Daily Living    Activities  Points (1 or 0) Independence  (1 point)  NO supervision, direction, or personal assistance Dependence   (0 points)  WITH supervision, direction, personal assistance, or total care   Bathing [x]  (1 POINT) Bathes self completely or needs help in bathing only a single part of the body such as the back, genital area, or disabled extremity []  (0 POINTS) Need help with bathing more than one part of the body, getting in or out of the tub or showering. Requires total bathing.    Dressing [x]  (1 POINT) Get clothes from  closets and drawers and puts on clothes and outer garments complete with fasteners. May have help tying shoes.  []  (0 POINTS) Needs help with dressing self or needs to be completely dressed.    Toileting [x]  (1 POINT) Goes to toileting, gets on and off, arranges clothes,  cleans genital area without help  []  (0 POINTS) Needs help transferring to the toileting, cleaning self or uses bedpan or commode    Transferring [x]  (1 POINT) Moves in and out of the bed or chair unassisted. Mechanical transfer aids are acceptable  []  (0 POINTS) Needs help in moving from bed to chair or requires a complete transfer   Continence  [x]  (1 POINT) Exercises complete self control over urination and defecation  []  (0 POINTS) Is partially or totally incontinent of bowel or bladder   Feeding  [x]  (1 POINT) Gets food from plate into mouth without help. Preparation of food may be done by another person []  (0 POINTS) Needs partial or total help with feeding or requires parenteral feeding.     Total Score = 6             Scoring: 6 = High (patient independent) 0 = Low (patient very dependent)      By , PhD, APRN, BC, [facility], and , PhD, ARNP, [facility]    FINAL LOCATION     Patient positioned in bed, all needs within reach, agrees to call for assistance, (+) MD present    Thank you for this referral.    , OTD, OTR/L  March 29, 2022

## 2022-03-29 NOTE — Discharge Instructions (Signed)
Diet: Diabetic    Activity: No driving until follow-up with PCP     Follow-up: PCP within 1 week.  GYN ASAP given findings on CTAP at [facility]

## 2022-03-29 NOTE — Progress Notes (Signed)
Pt d/c to home with family via WC. Reviewed prescriptions, F/u MD appts, and d/c instructions with pt. Copy of d/c paperwork sent with pt.     Insulin prescription changed at outpt level RT insurance and cost. 70/30 prescription reviewed with pt and family with Jewel Baize and myself. To replace Lantus and Humalog scripts.

## 2022-03-29 NOTE — Progress Notes (Signed)
Discharge Plan:   home OPPT    Discharge Date:     03/29/2022      Transportation: Family/ at bedside       Family, MD, patient, nurse and facility aware of pickup time    pt RN

## 2022-03-29 NOTE — Discharge Summary (Signed)
Admit Date: 03/25/2022  Discharge Date:  03/29/2022      Patient ID:  Barbara Doyle  46 y.o.  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 Trial of Valium  Diabetic ketoacidosis  A1c 10.8, resolved.   Large complex cystic lesion 24 x 16 x 24 cm on CT abdomen pelvis 03/25/2022 at [facility].  D/W pt- will need to see her GYN when she returns to [facility]  Hypertension   Type 2 diabetes mellitus. Had been on Metformin. Will need addition of insulin for now  History of endometriosis  History of anxiety       Medication List        START taking these medications      AIMSCO INSULIN SYR MAXI-COMFRT 28G X 1/2" 1 ML Misc  Generic drug: INS SYRINGE/NEEDLE 1CC/28G  As directed     aspirin 81 MG chewable tablet  Take 1 tablet by mouth daily  Start taking on: March 30, 2022     atorvastatin 40 MG tablet  Commonly known as: LIPITOR  Take 1 tablet by mouth nightly     blood glucose test strips  Test 3 times a day & as needed for symptoms of irregular blood glucose.     diazePAM 5 MG tablet  Commonly known as: VALIUM  Take 1 tablet by mouth every 8 hours as needed for Anxiety (vertigo) for up to 10 days. Max Daily Amount: 15 mg     glucose monitoring kit  1 kit by Does not apply route daily Whichever brand insurance covers     insulin glargine 100 UNIT/ML injection vial  Commonly known as: LANTUS  Inject 20 Units into the skin nightly     insulin lispro 100 UNIT/ML Soln injection vial  Commonly known as: HUMALOG  Inject 5 Units into the skin 3 times daily (before meals)     Lancets Misc  As directed     losartan 50 MG tablet  Commonly known as: Cozaar  Take 1 tablet by mouth daily     ondansetron 4 MG disintegrating tablet  Commonly known as: ZOFRAN-ODT  Take 1 tablet by mouth every 8 hours as needed for Nausea or Vomiting            CONTINUE taking  these medications      hydroCHLOROthiazide 25 MG tablet  Commonly known as: HYDRODIURIL     labetalol 100 MG tablet  Commonly known as: NORMODYNE     sertraline 25 MG tablet  Commonly known as: ZOLOFT            STOP taking these medications      metFORMIN 500 MG tablet  Commonly known as: GLUCOPHAGE               Where to Get Your Medications        These medications were sent to [facility] [address], [place]  [place], [place] [address]      Phone: [phone]   AIMSCO INSULIN SYR MAXI-COMFRT 28G X 1/2" 1 ML Misc  aspirin 81 MG chewable tablet  atorvastatin 40 MG tablet  blood glucose test strips  diazePAM 5 MG tablet  glucose monitoring kit  insulin  glargine 100 UNIT/ML injection vial  insulin lispro 100 UNIT/ML Soln injection vial  Lancets Misc  losartan 50 MG tablet  ondansetron 4 MG disintegrating tablet                Discharge Summary      Patient: [name] Age: 46 y.o. Sex: female    Date of Birth: 11-10-1975 Admit Date: 03/25/2022 PCP: None None   MRN: [id number]  CSN: [id number]       PCP:  None None    Date/Time:  03/29/2022 11:18 AM    Initial presentation: (Per Dr Tonye Royalty)  Barbara Doyle is a 46 y.o. year old female past medical history of hypertension, non-insulin-dependent diabetes, endometriosis and chronic anxiety who presents today as a transfer from [facility] for concern of acute stroke.     [place], went to ER yesterday and left prior to being seen (per ER notes). Returned to ER today with ongoing nausea, vomiting, dizziness, room spinning sensation.  She is been unable to tolerate any p.o. medications since yesterday.  She does report a dull aching pain on the right side of her abdomen that is been intermittent for at least 1 week if not longer.  Denies any history of stroke or family history of stroke.  Denies any recent changes to her medications.  She reported that her  symptoms actually began on Tuesday approximately 48 hours prior to initial evaluation, she reports dizziness worsens with any change in positioning and began when she was walking towards a restaurant around 1:30 PM Tuesday June 27.  She states opening her eyes and moving her head makes the dizziness much worse as does any relative position change.  She has not been able to take any of her home medications due to her nausea and vomiting.  She reports feeling very anxious.     She went to the [facility] emergency room earlier today again with her symptoms was noted to have a blood pressure 212/122 and a pulse of 90 on initial evaluation.  Labs this morning showed a white count of 14 hemoglobin 13 platelet count 590, CMP identifies a CO2 of 18 calcium 10.5 glucose of 407 sodium 135 potassium 4.5 creatinine of 0.84 anion gap elevated to 20.  Urinalysis positive for ketones.  She was started on IV fluids given several pushes of IV labetalol Zofran and meclizine with ongoing nausea.  She did have a head CT formal report reads as patchy area of low-attenuation posterior left cerebellar hemisphere suggestive of infarct of indeterminate age, suspect subacute.  She underwent brain MRI which confirms acute left PICA territory infarct, no report of hemorrhagic conversion.  There is mild regional mass effect without herniation in that territory poor MRI report time to 2:49 PM.     Additionally patient had a CT of her abdomen and pelvis with IV contrast this morning identifying a 24 x 16 x 24 cm peripheral enhancing nodularity cystic lesion in the abdomen concern for possible ovarian neoplasm or cystic lesion.     Patient was transferred to our facility for higher level of care.     Upon arrival here patient's blood sugar is still significantly elevated and still with findings consistent with DKA for which she was started on IV fluids and IV insulin.  She required additional IV push of labetalol given systolic blood pressure  greater than 190 with emergent neurology consultation by Dr. Inda Merlin.  She had not been given any  antiplatelet therapy per med rec review of her [facility] records and 325 aspirin was recommended by neurology.     Hospital Course:   She was admitted to the intensive care unit for close observation, monitoring with a diagnosis of acute stroke.  She was seen and followed through ICU by Dr. Flossie Dibble from critical care medicine and she was seen by Dr. Inda Merlin from neurology.  She was given ASA 325 mg x 1 and then started on 81 mg daily, started on high intensity statin.  Initially had been placed on insulin infusion, she was then transitioned to basal/bolus insulin.  Blood pressure remained elevated, she ultimately got back on labetalol and HCTZ, initially placed on amlodipine although then this was changed to losartan given her diabetes.    Repeat head CT here 6/29 showed "acute infarction left cerebellum including left cerebellar tonsils.  Mild crowding foramen magnum.  No acute hemorrhage.  No hydrocephalus."  CTA of the head and neck showed no large vessel occlusion.  An echocardiogram showed normal LV systolic function with EF of 66%, mild concentric LVH, mildly dilated LA, negative saline contrast study.  No thrombus noted.  She needs aggressive risk factor modification    She has remained in sinus rhythm during this hospital stay.    She was transferred out of the intensive care unit 48 hours prior to discharge.  Her vertigo has improved and is now mainly just positional.  Diazepam has helped.    She has been seen by diabetic educator.  She will need insulin and she has been instructed on insulin administration.  She will be discharged on 20 units Lantus daily and Humalog 5 units with meals, this will need to be adjusted.  Metformin has been discontinued.    Currently she is afebrile.  Vital signs are stable.  Most recent blood pressures 134/89, 130/80, 125/79, 130/77.  Felt she is medically stable for  discharge.    Most recent laboratory studies: 03/28/2022 WBC 7.8 hemoglobin 12.7 hematocrit 38.9 platelet 416.  Sodium 135 potassium 3.2 (repleted) chloride 97 CO2 30 glucose 165 BUN 11 creatinine 0.53.  Hemoglobin A1c 10.8.  Total cholesterol 217 HDL 41 LDL 152 triglyceride 118.      Condition at discharge: Stable    Discharge Education/Instructions:  Diet: Diabetic  Activity: No driving until follow-up with PCP    Follow-up: PCP within 1 week.  GYN ASAP given findings on CTAP at [facility]    Disposition: Home    Total time for DC was greater than 50 minutes.      Deeann Dowse. Candis Shine, MD  March 29, 2022  11:18 AM

## 2022-03-29 NOTE — Plan of Care (Signed)
Problem: Discharge Planning  Goal: Discharge to home or other facility with appropriate resources  Outcome: Completed     Problem: Safety - Adult  Goal: Free from fall injury  Outcome: Completed     Problem: Pain  Goal: Verbalizes/displays adequate comfort level or baseline comfort level  Outcome: Completed     Problem: Skin/Tissue Integrity  Goal: Absence of new skin breakdown  Description: 1.  Monitor for areas of redness and/or skin breakdown  2.  Assess vascular access sites hourly  3.  Every 4-6 hours minimum:  Change oxygen saturation probe site  4.  Every 4-6 hours:  If on nasal continuous positive airway pressure, respiratory therapy assess nares and determine need for appliance change or resting period.  Outcome: Completed     Problem: Chronic Conditions and Co-morbidities  Goal: Patient's chronic conditions and co-morbidity symptoms are monitored and maintained or improved  Outcome: Completed     Problem: ABCDS Injury Assessment  Goal: Absence of physical injury  Outcome: Completed

## 2022-03-29 NOTE — Progress Notes (Signed)
To: Whom it may concern  Re: Barbara Doyle  29 March 2022    Please excuse Mr. Barbara Doyle' absence from work 03/28/2022 through 04/01/2022 due to his wife's hospitalization at [facility] 03/25/2022 through 03/29/2022.  He may return to work 04/02/2022.    Sincerely,        Barbara Schneiders. O'Donnelll, MD

## 2022-07-28 DEATH — deceased
